# Patient Record
Sex: Female | Born: 1975 | Race: Asian | Hispanic: No | Smoking: Never smoker
Health system: Southern US, Community
[De-identification: ages and names within clinical notes are randomized; demographics above are authoritative.]

## PROBLEM LIST (undated history)

## (undated) DIAGNOSIS — K219 Gastro-esophageal reflux disease without esophagitis: Secondary | ICD-10-CM

---

## 2012-01-10 ENCOUNTER — Other Ambulatory Visit (HOSPITAL_COMMUNITY)
Admission: RE | Admit: 2012-01-10 | Discharge: 2012-01-10 | Disposition: A | Discharge status: Home / Self Care | Payer: BC Managed Care – PPO | Source: Ambulatory Visit | Attending: Family Medicine | Admitting: Family Medicine

## 2012-01-10 DIAGNOSIS — Z124 Encounter for screening for malignant neoplasm of cervix: Secondary | ICD-10-CM | POA: Insufficient documentation

## 2012-01-10 DIAGNOSIS — Z1151 Encounter for screening for human papillomavirus (HPV): Secondary | ICD-10-CM | POA: Insufficient documentation

## 2014-04-14 ENCOUNTER — Other Ambulatory Visit (HOSPITAL_COMMUNITY)
Admission: RE | Admit: 2014-04-14 | Discharge: 2014-04-14 | Disposition: A | Discharge status: Home / Self Care | Payer: BLUE CROSS/BLUE SHIELD | Source: Ambulatory Visit | Attending: Family Medicine | Admitting: Family Medicine

## 2014-04-14 ENCOUNTER — Other Ambulatory Visit: Payer: Self-pay | Admitting: Family Medicine

## 2014-04-14 DIAGNOSIS — Z124 Encounter for screening for malignant neoplasm of cervix: Secondary | ICD-10-CM | POA: Diagnosis not present

## 2014-04-15 LAB — CYTOLOGY - PAP

## 2015-07-05 DIAGNOSIS — Z3169 Encounter for other general counseling and advice on procreation: Secondary | ICD-10-CM | POA: Diagnosis not present

## 2015-07-05 DIAGNOSIS — Z130 Encounter for screening for diseases of the blood and blood-forming organs and certain disorders involving the immune mechanism: Secondary | ICD-10-CM | POA: Diagnosis not present

## 2015-08-27 DIAGNOSIS — Z3201 Encounter for pregnancy test, result positive: Secondary | ICD-10-CM | POA: Diagnosis not present

## 2015-09-23 DIAGNOSIS — O09511 Supervision of elderly primigravida, first trimester: Secondary | ICD-10-CM | POA: Diagnosis not present

## 2015-09-23 DIAGNOSIS — Z113 Encounter for screening for infections with a predominantly sexual mode of transmission: Secondary | ICD-10-CM | POA: Diagnosis not present

## 2015-09-23 DIAGNOSIS — Z3A10 10 weeks gestation of pregnancy: Secondary | ICD-10-CM | POA: Diagnosis not present

## 2015-09-23 LAB — OB RESULTS CONSOLE HIV ANTIBODY (ROUTINE TESTING): HIV: NONREACTIVE

## 2015-09-23 LAB — OB RESULTS CONSOLE RUBELLA ANTIBODY, IGM: Rubella: IMMUNE

## 2015-09-23 LAB — OB RESULTS CONSOLE HEPATITIS B SURFACE ANTIGEN: HEP B S AG: NEGATIVE

## 2015-09-23 LAB — OB RESULTS CONSOLE RPR: RPR: NONREACTIVE

## 2015-09-23 LAB — OB RESULTS CONSOLE GC/CHLAMYDIA
Chlamydia: NEGATIVE
Gonorrhea: NEGATIVE

## 2015-09-23 LAB — OB RESULTS CONSOLE ABO/RH: RH TYPE: POSITIVE

## 2015-09-23 LAB — OB RESULTS CONSOLE ANTIBODY SCREEN: Antibody Screen: NEGATIVE

## 2015-09-27 DIAGNOSIS — O09511 Supervision of elderly primigravida, first trimester: Secondary | ICD-10-CM | POA: Diagnosis not present

## 2015-09-27 DIAGNOSIS — Z3A10 10 weeks gestation of pregnancy: Secondary | ICD-10-CM | POA: Diagnosis not present

## 2015-11-05 DIAGNOSIS — O09512 Supervision of elderly primigravida, second trimester: Secondary | ICD-10-CM | POA: Diagnosis not present

## 2015-11-05 DIAGNOSIS — Z36 Encounter for antenatal screening of mother: Secondary | ICD-10-CM | POA: Diagnosis not present

## 2015-11-05 DIAGNOSIS — Z3A16 16 weeks gestation of pregnancy: Secondary | ICD-10-CM | POA: Diagnosis not present

## 2015-11-18 DIAGNOSIS — Z3A18 18 weeks gestation of pregnancy: Secondary | ICD-10-CM | POA: Diagnosis not present

## 2015-11-18 DIAGNOSIS — O09512 Supervision of elderly primigravida, second trimester: Secondary | ICD-10-CM | POA: Diagnosis not present

## 2016-01-31 DIAGNOSIS — Z23 Encounter for immunization: Secondary | ICD-10-CM | POA: Diagnosis not present

## 2016-01-31 DIAGNOSIS — Z3689 Encounter for other specified antenatal screening: Secondary | ICD-10-CM | POA: Diagnosis not present

## 2016-02-29 DIAGNOSIS — O09513 Supervision of elderly primigravida, third trimester: Secondary | ICD-10-CM | POA: Diagnosis not present

## 2016-02-29 DIAGNOSIS — Z3A32 32 weeks gestation of pregnancy: Secondary | ICD-10-CM | POA: Diagnosis not present

## 2016-03-17 DIAGNOSIS — Z3685 Encounter for antenatal screening for Streptococcus B: Secondary | ICD-10-CM | POA: Diagnosis not present

## 2016-03-17 LAB — OB RESULTS CONSOLE GBS: GBS: NEGATIVE

## 2016-04-07 DIAGNOSIS — Z3A38 38 weeks gestation of pregnancy: Secondary | ICD-10-CM | POA: Diagnosis not present

## 2016-04-07 DIAGNOSIS — O09513 Supervision of elderly primigravida, third trimester: Secondary | ICD-10-CM | POA: Diagnosis not present

## 2016-04-25 ENCOUNTER — Encounter (HOSPITAL_COMMUNITY): Payer: Self-pay | Admitting: *Deleted

## 2016-04-25 ENCOUNTER — Inpatient Hospital Stay (HOSPITAL_COMMUNITY): Payer: BLUE CROSS/BLUE SHIELD | Admitting: Anesthesiology

## 2016-04-25 ENCOUNTER — Inpatient Hospital Stay (HOSPITAL_COMMUNITY)
Admission: AD | Admit: 2016-04-25 | Discharge: 2016-04-29 | DRG: 765 | Disposition: A | Discharge status: Home / Self Care | Payer: BLUE CROSS/BLUE SHIELD | Source: Ambulatory Visit | Attending: Obstetrics & Gynecology | Admitting: Obstetrics & Gynecology

## 2016-04-25 DIAGNOSIS — D509 Iron deficiency anemia, unspecified: Secondary | ICD-10-CM | POA: Diagnosis not present

## 2016-04-25 DIAGNOSIS — Z9889 Other specified postprocedural states: Secondary | ICD-10-CM

## 2016-04-25 DIAGNOSIS — Z3A40 40 weeks gestation of pregnancy: Secondary | ICD-10-CM

## 2016-04-25 DIAGNOSIS — Z833 Family history of diabetes mellitus: Secondary | ICD-10-CM | POA: Diagnosis not present

## 2016-04-25 DIAGNOSIS — O43813 Placental infarction, third trimester: Secondary | ICD-10-CM | POA: Diagnosis not present

## 2016-04-25 DIAGNOSIS — O09513 Supervision of elderly primigravida, third trimester: Secondary | ICD-10-CM | POA: Diagnosis not present

## 2016-04-25 DIAGNOSIS — O480 Post-term pregnancy: Secondary | ICD-10-CM | POA: Diagnosis not present

## 2016-04-25 DIAGNOSIS — O9081 Anemia of the puerperium: Secondary | ICD-10-CM | POA: Diagnosis not present

## 2016-04-25 DIAGNOSIS — D62 Acute posthemorrhagic anemia: Secondary | ICD-10-CM | POA: Diagnosis not present

## 2016-04-25 DIAGNOSIS — Z23 Encounter for immunization: Secondary | ICD-10-CM | POA: Diagnosis not present

## 2016-04-25 DIAGNOSIS — O4103X0 Oligohydramnios, third trimester, not applicable or unspecified: Principal | ICD-10-CM | POA: Diagnosis present

## 2016-04-25 DIAGNOSIS — Z3A41 41 weeks gestation of pregnancy: Secondary | ICD-10-CM | POA: Diagnosis not present

## 2016-04-25 DIAGNOSIS — O99013 Anemia complicating pregnancy, third trimester: Secondary | ICD-10-CM | POA: Diagnosis not present

## 2016-04-25 HISTORY — DX: Gastro-esophageal reflux disease without esophagitis: K21.9

## 2016-04-25 LAB — TYPE AND SCREEN
ABO/RH(D): B POS
ANTIBODY SCREEN: NEGATIVE

## 2016-04-25 LAB — CBC
HEMATOCRIT: 40.2 % (ref 36.0–46.0)
HEMOGLOBIN: 13.9 g/dL (ref 12.0–15.0)
MCH: 31.7 pg (ref 26.0–34.0)
MCHC: 34.6 g/dL (ref 30.0–36.0)
MCV: 91.8 fL (ref 78.0–100.0)
Platelets: 217 10*3/uL (ref 150–400)
RBC: 4.38 MIL/uL (ref 3.87–5.11)
RDW: 12.3 % (ref 11.5–15.5)
WBC: 10.4 10*3/uL (ref 4.0–10.5)

## 2016-04-25 LAB — ABO/RH: ABO/RH(D): B POS

## 2016-04-25 MED ORDER — DIPHENHYDRAMINE HCL 50 MG/ML IJ SOLN: 12.5000 mg | INTRAMUSCULAR | Status: DC | PRN

## 2016-04-25 MED ORDER — FENTANYL 2.5 MCG/ML BUPIVACAINE 1/10 % EPIDURAL INFUSION (WH - ANES)
14.0000 mL/h | INTRAMUSCULAR | Status: DC | PRN
  Administered 2016-04-25: 11.5 mL/h via EPIDURAL
  Administered 2016-04-26: 14 mL/h via EPIDURAL
  Filled 2016-04-25 (×2): qty 100

## 2016-04-25 MED ORDER — OXYCODONE-ACETAMINOPHEN 5-325 MG PO TABS: 2.0000 | ORAL_TABLET | ORAL | Status: DC | PRN

## 2016-04-25 MED ORDER — EPHEDRINE 5 MG/ML INJ: 10.0000 mg | INTRAVENOUS | Status: DC | PRN

## 2016-04-25 MED ORDER — SOD CITRATE-CITRIC ACID 500-334 MG/5ML PO SOLN
30.0000 mL | ORAL | Status: DC | PRN
  Administered 2016-04-26: 30 mL via ORAL
  Filled 2016-04-25: qty 15

## 2016-04-25 MED ORDER — LIDOCAINE HCL (PF) 1 % IJ SOLN
INTRAMUSCULAR | Status: DC | PRN
  Administered 2016-04-25: 3 mL via EPIDURAL
  Administered 2016-04-25: 4 mL via EPIDURAL

## 2016-04-25 MED ORDER — LACTATED RINGERS IV SOLN
INTRAVENOUS | Status: DC
  Administered 2016-04-25 – 2016-04-26 (×3): via INTRAVENOUS

## 2016-04-25 MED ORDER — ACETAMINOPHEN 325 MG PO TABS: 650.0000 mg | ORAL_TABLET | ORAL | Status: DC | PRN

## 2016-04-25 MED ORDER — ONDANSETRON HCL 4 MG/2ML IJ SOLN: 4.0000 mg | Freq: Four times a day (QID) | INTRAMUSCULAR | Status: DC | PRN

## 2016-04-25 MED ORDER — OXYCODONE-ACETAMINOPHEN 5-325 MG PO TABS: 1.0000 | ORAL_TABLET | ORAL | Status: DC | PRN

## 2016-04-25 MED ORDER — LACTATED RINGERS IV SOLN
500.0000 mL | Freq: Once | INTRAVENOUS | Status: AC
  Administered 2016-04-26: 500 mL via INTRAVENOUS

## 2016-04-25 MED ORDER — LACTATED RINGERS IV SOLN
500.0000 mL | INTRAVENOUS | Status: DC | PRN
  Administered 2016-04-25: 1000 mL via INTRAVENOUS

## 2016-04-25 MED ORDER — LIDOCAINE HCL (PF) 1 % IJ SOLN
30.0000 mL | INTRAMUSCULAR | Status: DC | PRN
  Filled 2016-04-25: qty 30

## 2016-04-25 MED ORDER — OXYTOCIN 40 UNITS IN LACTATED RINGERS INFUSION - SIMPLE MED
2.5000 [IU]/h | INTRAVENOUS | Status: DC
  Filled 2016-04-25: qty 1000

## 2016-04-25 MED ORDER — PHENYLEPHRINE 40 MCG/ML (10ML) SYRINGE FOR IV PUSH (FOR BLOOD PRESSURE SUPPORT): 80.0000 ug | PREFILLED_SYRINGE | INTRAVENOUS | Status: DC | PRN

## 2016-04-25 MED ORDER — TERBUTALINE SULFATE 1 MG/ML IJ SOLN: 0.2500 mg | Freq: Once | INTRAMUSCULAR | Status: DC | PRN

## 2016-04-25 MED ORDER — FLEET ENEMA 7-19 GM/118ML RE ENEM: 1.0000 | ENEMA | RECTAL | Status: DC | PRN

## 2016-04-25 MED ORDER — PHENYLEPHRINE 40 MCG/ML (10ML) SYRINGE FOR IV PUSH (FOR BLOOD PRESSURE SUPPORT)
80.0000 ug | PREFILLED_SYRINGE | INTRAVENOUS | Status: DC | PRN
  Filled 2016-04-25: qty 10

## 2016-04-25 MED ORDER — OXYTOCIN BOLUS FROM INFUSION: 500.0000 mL | Freq: Once | INTRAVENOUS | Status: DC

## 2016-04-25 MED ORDER — OXYTOCIN 40 UNITS IN LACTATED RINGERS INFUSION - SIMPLE MED
1.0000 m[IU]/min | INTRAVENOUS | Status: DC
Start: 2016-04-25 — End: 2016-04-26
  Administered 2016-04-25: 2 m[IU]/min via INTRAVENOUS

## 2016-04-25 NOTE — Anesthesia Procedure Notes (Signed)
Epidural Patient location during procedure: OB Start time: 04/25/2016 6:46 PM  Staffing Anesthesiologist: Mal AmabileFOSTER, Brax Walen Performed: anesthesiologist   Preanesthetic Checklist Completed: patient identified, site marked, surgical consent, pre-op evaluation, timeout performed, IV checked, risks and benefits discussed and monitors and equipment checked  Epidural Patient position: sitting Prep: site prepped and draped and DuraPrep Patient monitoring: continuous pulse ox and blood pressure Approach: midline Location: L3-L4 Injection technique: LOR air  Needle:  Needle type: Tuohy  Needle gauge: 17 G Needle length: 9 cm and 9 Needle insertion depth: 4 cm Catheter type: closed end flexible Catheter size: 19 Gauge Catheter at skin depth: 9 cm Test dose: negative and Other  Assessment Events: blood not aspirated, injection not painful, no injection resistance, negative IV test and no paresthesia  Additional Notes Patient identified. Risks and benefits discussed including failed block, incomplete  Pain control, post dural puncture headache, nerve damage, paralysis, blood pressure Changes, nausea, vomiting, reactions to medications-both toxic and allergic and post Partum back pain. All questions were answered. Patient expressed understanding and wished to proceed. Sterile technique was used throughout procedure. Epidural site was Dressed with sterile barrier dressing. No paresthesias, signs of intravascular injection Or signs of intrathecal spread were encountered.  Patient was more comfortable after the epidural was dosed. Please see RN's note for documentation of vital signs and FHR which are stable.

## 2016-04-25 NOTE — Anesthesia Pain Management Evaluation Note (Signed)
  CRNA Pain Management Visit Note  Patient: Vanessa GreeningKaren Reifschneider, 41 y.o., female  "Hello I am a [facility] of the [facility]. We have an anesthesia team available at all times to provide care throughout the hospital, including epidural management and anesthesia for C-section. I don't know your plan for the delivery whether it a natural birth, water birth, IV sedation, nitrous supplementation, doula or epidural, but we want to meet your pain goals."   1.Was your pain managed to your expectations on prior hospitalizations?   No prior hospitalizations  2.What is your expectation for pain management during this hospitalization?     Epidural  3.How can we help you reach that goal? epidural  Record the patient's initial score and the patient's pain goal.   Pain: 4  Pain Goal: 6 [facility] wants you to be able to say your pain was always managed very well.  Tylea Hise 04/25/2016

## 2016-04-25 NOTE — MAU Note (Signed)
Sent from office, 3cm dilated, contracting.  AFI 3, BPP6/8. Pt is for direct admit

## 2016-04-25 NOTE — H&P (Signed)
Vanessa GreeningKaren [name] is a 41 y.o. female G1P0 7150w6d presenting for Induction of Labor d/t OligoHydramnios post term.  HPP/HPI:  Normal pregnancy up to now.  [facility] revealed Oligo with AFI at 3+ (1%), EFW 8 Lbs.  BPP 6/8.  Favorable cervix.  Good FMs.  No AF leak.  No vaginal bleeding.  Irregular UCs.  No PIH Sx.  OB History    Gravida Para Term Preterm AB Living   1             SAB TAB Ectopic Multiple Live Births                 Past Medical History:  Diagnosis Date  . GERD (gastroesophageal reflux disease)    Past Surgical History:  Procedure Laterality Date  . NO PAST SURGERIES     Family History: family history includes Cancer in her maternal aunt; Diabetes in her father; Kidney disease in her mother. Social History:  reports that she has never smoked. She has never used smokeless tobacco. She reports that she does not drink alcohol or use drugs.  Allergies  Allergen Reactions  . Fluconazole Nausea And Vomiting and Other (See Comments)    thrush      Blood pressure 119/73, pulse 78, temperature 98.2 F (36.8 C), temperature source Oral, resp. rate 16, height 5\' 1"  (1.549 m), weight 141 lb 8 oz (64.2 kg).   Exam Physical Exam   VE in office 3/100/Vtx/-1 intact  FHR 140's with good variability, accelerations present, no deceleration. Irregular UCs q about 6 min, mild.  HPP:  Patient Active Problem List   Diagnosis Date Noted  . Normal labor and delivery 04/25/2016    Prenatal labs: ABO, Rh: --/--/B POS (02/06 1358) Antibody: PENDING (02/06 1358) Rubella: Immune RPR: Nonreactive (07/06 0000)  HBsAg: Negative (07/06 0000)  HIV: Non-reactive (07/06 0000)  Genetic testing: Panorama low risk female, AFP1 neg. US anato: wnl 1 hr GTT: wnl GBS: Negative (12/29 0000)   Assessment/Plan: G1 40 6/7 wks with OligoHydramnios.  FHR Cat 1.  Induction with Pitocin.  Expectant management towards probable vaginal delivery.  Epidural PRN.  Marie-Lyne  Trace Wirick 04/25/2016, 3:16 PM

## 2016-04-25 NOTE — Anesthesia Preprocedure Evaluation (Signed)
Anesthesia Evaluation  Patient identified by MRN, date of birth, ID band Patient awake    Reviewed: Allergy & Precautions, H&P , Patient's Chart, lab work & pertinent test results  Airway Mallampati: I  TM Distance: >3 FB Neck ROM: full    Dental no notable dental hx.    Pulmonary neg pulmonary ROS,    Pulmonary exam normal        Cardiovascular negative cardio ROS Normal cardiovascular exam     Neuro/Psych negative neurological ROS  negative psych ROS   GI/Hepatic Neg liver ROS,   Endo/Other  negative endocrine ROS  Renal/GU negative Renal ROS     Musculoskeletal negative musculoskeletal ROS (+)   Abdominal Normal abdominal exam  (+)   Peds  Hematology negative hematology ROS (+)   Anesthesia Other Findings   Reproductive/Obstetrics (+) Pregnancy                             Anesthesia Physical Anesthesia Plan  ASA: II  Anesthesia Plan: Epidural   Post-op Pain Management:    Induction:   Airway Management Planned:   Additional Equipment:   Intra-op Plan:   Post-operative Plan:   Informed Consent: I have reviewed the patients History and Physical, chart, labs and discussed the procedure including the risks, benefits and alternatives for the proposed anesthesia with the patient or authorized representative who has indicated his/her understanding and acceptance.     Plan Discussed with:   Anesthesia Plan Comments:         Anesthesia Quick Evaluation

## 2016-04-26 ENCOUNTER — Encounter (HOSPITAL_COMMUNITY): Payer: Self-pay | Admitting: Obstetrics and Gynecology

## 2016-04-26 ENCOUNTER — Encounter (HOSPITAL_COMMUNITY)
Admission: AD | Disposition: A | Discharge status: Home / Self Care | Payer: Self-pay | Source: Ambulatory Visit | Attending: Obstetrics & Gynecology

## 2016-04-26 DIAGNOSIS — Z9889 Other specified postprocedural states: Secondary | ICD-10-CM

## 2016-04-26 LAB — RPR: RPR Ser Ql: NONREACTIVE

## 2016-04-26 SURGERY — Surgical Case
Anesthesia: Epidural

## 2016-04-26 MED ORDER — NALBUPHINE HCL 10 MG/ML IJ SOLN: 5.0000 mg | Freq: Once | INTRAMUSCULAR | Status: DC | PRN

## 2016-04-26 MED ORDER — DIPHENHYDRAMINE HCL 50 MG/ML IJ SOLN
12.5000 mg | INTRAMUSCULAR | Status: DC | PRN
Start: 2016-04-26 — End: 2016-04-28

## 2016-04-26 MED ORDER — CEFAZOLIN SODIUM-DEXTROSE 2-3 GM-% IV SOLR
INTRAVENOUS | Status: DC | PRN
  Administered 2016-04-26: 2 g via INTRAVENOUS

## 2016-04-26 MED ORDER — SIMETHICONE 80 MG PO CHEW
80.0000 mg | CHEWABLE_TABLET | Freq: Three times a day (TID) | ORAL | Status: DC
  Administered 2016-04-26 – 2016-04-29 (×7): 80 mg via ORAL
  Filled 2016-04-26 (×6): qty 1

## 2016-04-26 MED ORDER — IBUPROFEN 600 MG PO TABS: 600.0000 mg | ORAL_TABLET | Freq: Four times a day (QID) | ORAL | Status: DC | PRN

## 2016-04-26 MED ORDER — NALBUPHINE HCL 10 MG/ML IJ SOLN: 5.0000 mg | INTRAMUSCULAR | Status: DC | PRN

## 2016-04-26 MED ORDER — FENTANYL CITRATE (PF) 100 MCG/2ML IJ SOLN: 25.0000 ug | INTRAMUSCULAR | Status: DC | PRN

## 2016-04-26 MED ORDER — MORPHINE SULFATE (PF) 0.5 MG/ML IJ SOLN
INTRAMUSCULAR | Status: DC | PRN
Start: 2016-04-26 — End: 2016-04-26
  Administered 2016-04-26: 3 mg via EPIDURAL

## 2016-04-26 MED ORDER — ONDANSETRON HCL 4 MG/2ML IJ SOLN
INTRAMUSCULAR | Status: DC | PRN
  Administered 2016-04-26: 4 mg via INTRAVENOUS

## 2016-04-26 MED ORDER — MEPERIDINE HCL 25 MG/ML IJ SOLN: 6.2500 mg | INTRAMUSCULAR | Status: DC | PRN

## 2016-04-26 MED ORDER — PRENATAL MULTIVITAMIN CH
1.0000 | ORAL_TABLET | Freq: Every day | ORAL | Status: DC
  Administered 2016-04-27 – 2016-04-28 (×2): 1 via ORAL
  Filled 2016-04-26 (×2): qty 1

## 2016-04-26 MED ORDER — KETOROLAC TROMETHAMINE 30 MG/ML IJ SOLN: 30.0000 mg | Freq: Once | INTRAMUSCULAR | Status: DC

## 2016-04-26 MED ORDER — COCONUT OIL OIL
1.0000 "application " | TOPICAL_OIL | Status: DC | PRN
  Administered 2016-04-28: 1 via TOPICAL
  Filled 2016-04-26: qty 120

## 2016-04-26 MED ORDER — SIMETHICONE 80 MG PO CHEW: 80.0000 mg | CHEWABLE_TABLET | ORAL | Status: DC | PRN

## 2016-04-26 MED ORDER — BUPIVACAINE HCL (PF) 0.25 % IJ SOLN
INTRAMUSCULAR | Status: AC
  Filled 2016-04-26: qty 10

## 2016-04-26 MED ORDER — SODIUM BICARBONATE 8.4 % IV SOLN
INTRAVENOUS | Status: AC
  Filled 2016-04-26: qty 50

## 2016-04-26 MED ORDER — OXYCODONE HCL 5 MG PO TABS: 10.0000 mg | ORAL_TABLET | ORAL | Status: DC | PRN

## 2016-04-26 MED ORDER — OXYCODONE HCL 5 MG PO TABS: 5.0000 mg | ORAL_TABLET | ORAL | Status: DC | PRN

## 2016-04-26 MED ORDER — DIBUCAINE 1 % RE OINT: 1.0000 "application " | TOPICAL_OINTMENT | RECTAL | Status: DC | PRN

## 2016-04-26 MED ORDER — OXYTOCIN 10 UNIT/ML IJ SOLN
INTRAMUSCULAR | Status: AC
  Filled 2016-04-26: qty 4

## 2016-04-26 MED ORDER — IBUPROFEN 600 MG PO TABS
600.0000 mg | ORAL_TABLET | Freq: Four times a day (QID) | ORAL | Status: DC
  Administered 2016-04-26 – 2016-04-29 (×12): 600 mg via ORAL
  Filled 2016-04-26 (×12): qty 1

## 2016-04-26 MED ORDER — SCOPOLAMINE 1 MG/3DAYS TD PT72: 1.0000 | MEDICATED_PATCH | Freq: Once | TRANSDERMAL | Status: DC

## 2016-04-26 MED ORDER — KETOROLAC TROMETHAMINE 30 MG/ML IJ SOLN
30.0000 mg | Freq: Four times a day (QID) | INTRAMUSCULAR | Status: AC | PRN
  Administered 2016-04-26: 30 mg via INTRAMUSCULAR

## 2016-04-26 MED ORDER — ACETAMINOPHEN 325 MG PO TABS
650.0000 mg | ORAL_TABLET | ORAL | Status: DC | PRN
  Administered 2016-04-28: 650 mg via ORAL
  Filled 2016-04-26: qty 2

## 2016-04-26 MED ORDER — MORPHINE SULFATE (PF) 0.5 MG/ML IJ SOLN
INTRAMUSCULAR | Status: AC
  Filled 2016-04-26: qty 10

## 2016-04-26 MED ORDER — BUPIVACAINE HCL 0.25 % IJ SOLN
INTRAMUSCULAR | Status: DC | PRN
  Administered 2016-04-26: 10 mL

## 2016-04-26 MED ORDER — ONDANSETRON HCL 4 MG/2ML IJ SOLN: 4.0000 mg | Freq: Three times a day (TID) | INTRAMUSCULAR | Status: DC | PRN

## 2016-04-26 MED ORDER — NALOXONE HCL 2 MG/2ML IJ SOSY
1.0000 ug/kg/h | PREFILLED_SYRINGE | INTRAVENOUS | Status: DC | PRN
  Filled 2016-04-26: qty 2

## 2016-04-26 MED ORDER — MENTHOL 3 MG MT LOZG: 1.0000 | LOZENGE | OROMUCOSAL | Status: DC | PRN

## 2016-04-26 MED ORDER — SODIUM CHLORIDE 0.9% FLUSH: 3.0000 mL | INTRAVENOUS | Status: DC | PRN

## 2016-04-26 MED ORDER — LACTATED RINGERS IV SOLN
INTRAVENOUS | Status: DC | PRN
  Administered 2016-04-26 (×4): via INTRAVENOUS

## 2016-04-26 MED ORDER — ONDANSETRON HCL 4 MG/2ML IJ SOLN
INTRAMUSCULAR | Status: AC
  Filled 2016-04-26: qty 2

## 2016-04-26 MED ORDER — ACETAMINOPHEN 500 MG PO TABS
1000.0000 mg | ORAL_TABLET | Freq: Four times a day (QID) | ORAL | Status: AC
  Administered 2016-04-27 (×2): 1000 mg via ORAL
  Filled 2016-04-26 (×2): qty 2

## 2016-04-26 MED ORDER — LACTATED RINGERS IV SOLN
INTRAVENOUS | Status: DC
  Administered 2016-04-26 (×2): via INTRAVENOUS

## 2016-04-26 MED ORDER — SENNOSIDES-DOCUSATE SODIUM 8.6-50 MG PO TABS
2.0000 | ORAL_TABLET | ORAL | Status: DC
  Administered 2016-04-27 – 2016-04-28 (×3): 2 via ORAL
  Filled 2016-04-26 (×3): qty 2

## 2016-04-26 MED ORDER — DIPHENHYDRAMINE HCL 25 MG PO CAPS
25.0000 mg | ORAL_CAPSULE | ORAL | Status: DC | PRN
Start: 2016-04-26 — End: 2016-04-28
  Filled 2016-04-26: qty 1

## 2016-04-26 MED ORDER — OXYTOCIN 10 UNIT/ML IJ SOLN
INTRAMUSCULAR | Status: DC | PRN
  Administered 2016-04-26: 40 [IU] via INTRAVENOUS

## 2016-04-26 MED ORDER — KETOROLAC TROMETHAMINE 30 MG/ML IJ SOLN: 30.0000 mg | Freq: Four times a day (QID) | INTRAMUSCULAR | Status: AC | PRN

## 2016-04-26 MED ORDER — LIDOCAINE-EPINEPHRINE (PF) 2 %-1:200000 IJ SOLN
INTRAMUSCULAR | Status: DC | PRN
  Administered 2016-04-26: 10 mL via EPIDURAL

## 2016-04-26 MED ORDER — KETOROLAC TROMETHAMINE 30 MG/ML IJ SOLN
INTRAMUSCULAR | Status: AC
  Filled 2016-04-26: qty 1

## 2016-04-26 MED ORDER — FERROUS SULFATE 325 (65 FE) MG PO TABS
325.0000 mg | ORAL_TABLET | Freq: Two times a day (BID) | ORAL | Status: DC
  Administered 2016-04-26 – 2016-04-29 (×6): 325 mg via ORAL
  Filled 2016-04-26 (×6): qty 1

## 2016-04-26 MED ORDER — DIPHENHYDRAMINE HCL 25 MG PO CAPS: 25.0000 mg | ORAL_CAPSULE | Freq: Four times a day (QID) | ORAL | Status: DC | PRN

## 2016-04-26 MED ORDER — WITCH HAZEL-GLYCERIN EX PADS: 1.0000 "application " | MEDICATED_PAD | CUTANEOUS | Status: DC | PRN

## 2016-04-26 MED ORDER — PHENYLEPHRINE 40 MCG/ML (10ML) SYRINGE FOR IV PUSH (FOR BLOOD PRESSURE SUPPORT)
PREFILLED_SYRINGE | INTRAVENOUS | Status: AC
  Filled 2016-04-26: qty 10

## 2016-04-26 MED ORDER — LACTATED RINGERS IV SOLN
INTRAVENOUS | Status: DC | PRN
  Administered 2016-04-26: 09:00:00 via INTRAVENOUS

## 2016-04-26 MED ORDER — SIMETHICONE 80 MG PO CHEW
80.0000 mg | CHEWABLE_TABLET | ORAL | Status: DC
  Administered 2016-04-27 – 2016-04-28 (×3): 80 mg via ORAL
  Filled 2016-04-26 (×3): qty 1

## 2016-04-26 MED ORDER — NALOXONE HCL 0.4 MG/ML IJ SOLN: 0.4000 mg | INTRAMUSCULAR | Status: DC | PRN

## 2016-04-26 MED ORDER — ZOLPIDEM TARTRATE 5 MG PO TABS: 5.0000 mg | ORAL_TABLET | Freq: Every evening | ORAL | Status: DC | PRN

## 2016-04-26 MED ORDER — PROMETHAZINE HCL 25 MG/ML IJ SOLN: 6.2500 mg | INTRAMUSCULAR | Status: DC | PRN

## 2016-04-26 MED ORDER — LIDOCAINE-EPINEPHRINE (PF) 2 %-1:200000 IJ SOLN
INTRAMUSCULAR | Status: AC
  Filled 2016-04-26: qty 20

## 2016-04-26 MED ORDER — TETANUS-DIPHTH-ACELL PERTUSSIS 5-2.5-18.5 LF-MCG/0.5 IM SUSP
0.5000 mL | Freq: Once | INTRAMUSCULAR | Status: DC
Start: 2016-04-27 — End: 2016-04-28

## 2016-04-26 MED ORDER — MAGNESIUM HYDROXIDE 400 MG/5ML PO SUSP: 30.0000 mL | ORAL | Status: DC | PRN

## 2016-04-26 MED ORDER — OXYTOCIN 40 UNITS IN LACTATED RINGERS INFUSION - SIMPLE MED: 2.5000 [IU]/h | INTRAVENOUS | Status: AC

## 2016-04-26 MED ORDER — PHENYLEPHRINE HCL 10 MG/ML IJ SOLN
INTRAMUSCULAR | Status: DC | PRN
  Administered 2016-04-26: 40 ug via INTRAVENOUS

## 2016-04-26 SURGICAL SUPPLY — 37 items
CHLORAPREP W/TINT 26ML (MISCELLANEOUS) ×2 IMPLANT
CLAMP CORD UMBIL (MISCELLANEOUS) IMPLANT
CLOTH BEACON ORANGE TIMEOUT ST (SAFETY) ×2 IMPLANT
CONTAINER PREFILL 10% NBF 15ML (MISCELLANEOUS) IMPLANT
DERMABOND ADVANCED (GAUZE/BANDAGES/DRESSINGS)
DERMABOND ADVANCED .7 DNX12 (GAUZE/BANDAGES/DRESSINGS) IMPLANT
ELECT REM PT RETURN 9FT ADLT (ELECTROSURGICAL) ×2
ELECTRODE REM PT RTRN 9FT ADLT (ELECTROSURGICAL) ×1 IMPLANT
EXTRACTOR VACUUM M CUP 4 TUBE (SUCTIONS) IMPLANT
GLOVE BIO SURGEON STRL SZ 6.5 (GLOVE) ×2 IMPLANT
GLOVE BIOGEL PI IND STRL 7.0 (GLOVE) ×2 IMPLANT
GLOVE BIOGEL PI INDICATOR 7.0 (GLOVE) ×2
GOWN STRL REUS W/TWL LRG LVL3 (GOWN DISPOSABLE) ×4 IMPLANT
KIT ABG SYR 3ML LUER SLIP (SYRINGE) IMPLANT
NEEDLE HYPO 22GX1.5 SAFETY (NEEDLE) ×2 IMPLANT
NEEDLE HYPO 25X5/8 SAFETYGLIDE (NEEDLE) IMPLANT
PACK C SECTION WH (CUSTOM PROCEDURE TRAY) ×2 IMPLANT
PAD OB MATERNITY 4.3X12.25 (PERSONAL CARE ITEMS) ×2 IMPLANT
RTRCTR C-SECT PINK 25CM LRG (MISCELLANEOUS) ×2 IMPLANT
SUT MNCRL AB 3-0 PS2 27 (SUTURE) IMPLANT
SUT MON AB 4-0 PS1 27 (SUTURE) IMPLANT
SUT PLAIN 0 NONE (SUTURE) IMPLANT
SUT PLAIN 2 0 (SUTURE) ×1
SUT PLAIN ABS 2-0 CT1 27XMFL (SUTURE) ×1 IMPLANT
SUT VIC AB 0 CT1 27 (SUTURE) ×2
SUT VIC AB 0 CT1 27XBRD ANBCTR (SUTURE) ×2 IMPLANT
SUT VIC AB 0 CTX 36 (SUTURE) ×2
SUT VIC AB 0 CTX36XBRD ANBCTRL (SUTURE) ×2 IMPLANT
SUT VIC AB 2-0 CT1 27 (SUTURE) ×1
SUT VIC AB 2-0 CT1 TAPERPNT 27 (SUTURE) ×1 IMPLANT
SUT VIC AB 3-0 SH 27 (SUTURE)
SUT VIC AB 3-0 SH 27X BRD (SUTURE) IMPLANT
SUT VIC AB 4-0 PS2 27 (SUTURE) IMPLANT
SUT VICRYL 4-0 PS2 18IN ABS (SUTURE) ×2 IMPLANT
SYR CONTROL 10ML LL (SYRINGE) ×2 IMPLANT
TOWEL OR 17X24 6PK STRL BLUE (TOWEL DISPOSABLE) ×2 IMPLANT
TRAY FOLEY CATH SILVER 14FR (SET/KITS/TRAYS/PACK) ×2 IMPLANT

## 2016-04-26 NOTE — Transfer of Care (Signed)
Immediate Anesthesia Transfer of Care Note  Patient: Vanessa GreeningKaren [name]  Procedure(s) Performed: Procedure(s): CESAREAN SECTION (N/A)  Patient Location: PACU  Anesthesia Type:Epidural  Level of Consciousness: awake, alert  and oriented  Airway & Oxygen Therapy: Patient Spontanous Breathing  Post-op Assessment: Report given to RN and Post -op Vital signs reviewed and stable  Post vital signs: Reviewed and stable  Last Vitals:  Vitals:   04/26/16 0601 04/26/16 0631  BP: (!) 113/54 (!) 110/51  Pulse: 76 75  Resp: 16 16  Temp:      Last Pain:  Vitals:   04/26/16 0631  TempSrc:   PainSc: 0-No pain         Complications: No apparent anesthesia complications

## 2016-04-26 NOTE — Op Note (Signed)
Preoperative diagnosis: Intrauterine pregnancy at 41 weeks and 0 day                                            Oligohydramnios                                            Arrest of progression in active stage of labor                                            Non-reassuring Fetal Heart Rate monitoring with Repetitive deep Variable decelerations  Post operative diagnosis: Same  Anesthesia: Epidural  Anesthesiologist: Dr. Leilani AbleFranklin Hatchett  Procedure: Primary Urgent low transverse cesarean section  Surgeon: Dr. Genia DelMarie-Lyne Aiden Helzer  Assistant: Denton Meekolita Dawson   Estimated blood loss: 900 cc  Procedure:  After being informed of the planned procedure and possible complications including bleeding, infection, injury to other organs, informed consent is obtained. The patient is taken to OR #1 and the epidural anesthesia level was raised without complication. She is placed in the dorsal decubitus position with the pelvis tilted to the left. She is then prepped and draped in a sterile fashion. A Foley catheter is already inserted in her bladder.  After assessing adequate level of anesthesia, we infiltrate the suprapubic area with 10 cc of Marcaine 0.25 and perform a Pfannenstiel incision which is brought down sharply to the fascia. The fascia is entered in a low transverse fashion. Linea alba is dissected. Peritoneum is entered in a midline fashion. An Alexis retractor is easily positioned. Visceral peritoneum is entered in a low transverse fashion allowing us to safely retract bladder by developing a bladder flap.  The myometrium is then entered in a low transverse fashion; first with knife and then extended bluntly. Amniotic fluid is meconium. We assist the birth of a female  infant in cephalic presentation. Mouth and nose are suctioned. The baby is delivered. The cord is clamped and sectioned. The baby is given to the neonatologist present in the room.  10 cc of blood is drawn from the umbilical vein.   The PH is done and comes back at 7.24.The placenta is allowed to deliver spontaneously. It is complete and the cord has 3 vessels. Uterine revision is negative.  We proceed with closure of the myometrium in 2 layers: First with a running locked suture of 0 Vicryl, then with a Lembert suture of 0 Vicryl imbricating the first one. Hemostasis is completed with 2 figures of eight on the right aspect of the hysterotomy and cauterization on peritoneal edges.  Both paracolic gutters are cleaned. Both tubes and ovaries are assessed and normal. We confirm a satisfactory hemostasis.  Retractors and sponges are removed. Under fascia hemostasis is completed with cauterization.  The parietal peritoneum is closed with a running suture of Vicryl 2-0. The fascia is then closed with 2 running sutures of 0 Vicryl meeting midline.  Hemostasis is completed with cauterization. The adipose tissue is approximated with a running suture of Plain 2-0. The skin is closed with a subcuticular suture of 4-0 Vicryl and Dermabond.  Instrument and sponge count is complete x2. Estimated blood loss  is 900 cc.  The procedure is well tolerated by the patient who is taken to recovery room in a well and stable condition.  female baby named Shari Prows was born at 8:34 am and received an Apgar of 9  at 1 minute and 9 at 5 minutes. Weight was pending.    Specimen: Placenta sent to Patho.   Genia Del MD 2/7/20189:20 AM

## 2016-04-26 NOTE — Progress Notes (Signed)
At 2130 ambulation to bathroom RN noted epidural catheter left in by PACU. CRNA Jana caled to report. She is coming to bedside to remove. Platlets are normal and no BTL is planned this in mom's first child.

## 2016-04-26 NOTE — Lactation Note (Signed)
This note was copied from a baby's chart. Lactation Consultation Note  Assisted mother to latch baby to the left breast. Suckling appeared to be deep and swallows were noted but baby abraded the tip of the nipple. Baby did not attach to the right breast related to raised area on areola and positioning. Hand expression taught with colostrum easily visible.  Baby [facility]-skin [facility] and mom was ready for transport. Follow-up tomorrow or sooner if needed.  Patient Name: Boy Annell GreeningKaren Buskey ZOXWR'UToday's Date: 04/26/2016 Reason for consult: Initial assessment   Maternal Data Has patient been taught Hand Expression?: No (taught FOB as mother was too tired.) Does the patient have breastfeeding experience prior to this delivery?: No  Feeding Feeding Type: Breast Fed Length of feed: 30 min  LATCH Score/Interventions Latch: Repeated attempts needed to sustain latch, nipple held in mouth throughout feeding, stimulation needed to elicit sucking reflex.  Audible Swallowing: A few with stimulation  Type of Nipple: Everted at rest and after stimulation (pigmented raised area near nipple on rt areola)  Comfort (Breast/Nipple): Filling, red/small blisters or bruises, mild/mod discomfort (noted after detachment)     Hold (Positioning): Full assist, staff holds infant at breast  LATCH Score: 5  Lactation Tools Discussed/Used     Consult Status Consult Status: Follow-up Date: 04/27/16 Follow-up type: In-patient    Soyla DryerJoseph, Hannahmarie Asberry 04/26/2016, 10:44 AM

## 2016-04-26 NOTE — Anesthesia Postprocedure Evaluation (Signed)
Anesthesia Post Note  Patient: Annell GreeningKaren Alan  Procedure(s) Performed: Procedure(s) (LRB): CESAREAN SECTION (N/A)  Patient location during evaluation: PACU Anesthesia Type: Epidural Level of consciousness: awake Pain management: pain level controlled Vital Signs Assessment: post-procedure vital signs reviewed and stable Respiratory status: spontaneous breathing Cardiovascular status: stable Postop Assessment: no headache, no backache, patient able to bend at knees and no signs of nausea or vomiting Anesthetic complications: no        Last Vitals:  Vitals:   04/26/16 1005 04/26/16 1006  BP:    Pulse: 93 82  Resp: 19 17  Temp:      Last Pain:  Vitals:   04/26/16 0631  TempSrc:   PainSc: 0-No pain   Pain Goal:                 Harriette Tovey JR,JOHN Tonae Livolsi

## 2016-04-26 NOTE — Progress Notes (Signed)
Subjective: Doing well, pain controled, UCs q3 min  Anesthesia epidural   Objective: BP (!) 110/51   Pulse 75   Temp 98.7 F (37.1 C) (Oral)   Resp 16   Ht 5\' 1"  (1.549 m)   Wt 141 lb 8 oz (64.2 kg)   SpO2 99%   BMI 26.74 kg/m    FHT:  FHR: 145 bpm, variability: moderate,  accelerations:  Present,  decelerations:  Present Frequent variable decelerations mild to moderate with UCs UC:   regular, every 3 minutes VE:   Dilation: 9 Effacement (%): 100 Station: 0 Exam by:: Vanessa SchneidersMarie Mariyah Upshaw, MD   Occiput Rt ant.   Assessment / Plan: Induction for Oligo at 41 wks.  Stopped the Pitocin because of Repetitive Variable Decelerations.  FHR now improved but slow progression to complete dilation, so Pitocin restarted.  Fetal Wellbeing:  Category I Pain Control:  Epidural  Anticipated MOD:  Garded, possible Urgent C/S discussed  Vanessa [name] 04/26/2016, 7:08 AM

## 2016-04-26 NOTE — Progress Notes (Signed)
IV checked at 1240, not 1225

## 2016-04-26 NOTE — Anesthesia Postprocedure Evaluation (Addendum)
Anesthesia Post Note  Patient: Vanessa GreeningKaren [name]  Procedure(s) Performed: Procedure(s) (LRB): CESAREAN SECTION (N/A)  Patient location during evaluation: Mother Baby Anesthesia Type: Epidural Level of consciousness: awake, awake and alert and oriented Pain management: pain level controlled Vital Signs Assessment: post-procedure vital signs reviewed and stable Respiratory status: spontaneous breathing and nonlabored ventilation Cardiovascular status: stable Postop Assessment: no headache, no backache, epidural receding, patient able to bend at knees, no signs of nausea or vomiting and adequate PO intake Anesthetic complications: no        Last Vitals:  Vitals:   04/26/16 1005 04/26/16 1006  BP:    Pulse: 93 82  Resp: 19 17  Temp:      Last Pain:  Vitals:   04/26/16 0631  TempSrc:   PainSc: 0-No pain   Pain Goal:                 Land O'LakesMalinova,Nataliya Hristova

## 2016-04-26 NOTE — Addendum Note (Signed)
Addendum  created 04/26/16 2214 by Renford DillsJanet L Rahaf Carbonell, CRNA   Anesthesia Intra LDAs edited, LDA properties accepted

## 2016-04-26 NOTE — Addendum Note (Signed)
Addendum  created 04/26/16 1320 by Elgie CongoNataliya H Merrik Puebla, CRNA   Sign clinical note

## 2016-04-27 DIAGNOSIS — D62 Acute posthemorrhagic anemia: Secondary | ICD-10-CM | POA: Diagnosis not present

## 2016-04-27 LAB — CBC
HCT: 22.6 % — ABNORMAL LOW (ref 36.0–46.0)
Hemoglobin: 7.9 g/dL — ABNORMAL LOW (ref 12.0–15.0)
MCH: 31.7 pg (ref 26.0–34.0)
MCHC: 35 g/dL (ref 30.0–36.0)
MCV: 90.8 fL (ref 78.0–100.0)
PLATELETS: 137 10*3/uL — AB (ref 150–400)
RBC: 2.49 MIL/uL — AB (ref 3.87–5.11)
RDW: 12.4 % (ref 11.5–15.5)
WBC: 10.9 10*3/uL — AB (ref 4.0–10.5)

## 2016-04-27 MED ORDER — POLYSACCHARIDE IRON COMPLEX 150 MG PO CAPS
150.0000 mg | ORAL_CAPSULE | Freq: Two times a day (BID) | ORAL | Status: DC
  Administered 2016-04-27 – 2016-04-29 (×4): 150 mg via ORAL
  Filled 2016-04-27 (×4): qty 1

## 2016-04-27 MED ORDER — MAGNESIUM OXIDE 400 (241.3 MG) MG PO TABS
400.0000 mg | ORAL_TABLET | Freq: Every day | ORAL | Status: DC
  Administered 2016-04-27 – 2016-04-29 (×3): 400 mg via ORAL
  Filled 2016-04-27 (×5): qty 1

## 2016-04-27 NOTE — Lactation Note (Signed)
This note was copied from a baby's chart. Lactation Consultation Note  Patient Name: Vanessa Annell GreeningKaren Engelhard JXBJY'NToday's Date: 04/27/2016 Reason for consult: Follow-up assessment Baby at 32 hr of life. Mom has a vertical dark brown healing compression stripe across the L nipple and horizontal dark brown healing compression stripe across the R nipple. She denies breast or nipple pain. She stated that "sometimes when he latches it hurts so I take him off and then put him on again and its fine". She has what she calls a skin tag on the areola of the R breast near the nipple at 3 o'clock. She holds the skin tag back when she latches baby because she does not want to get it in his mouth. Discussed baby behavior, feeding frequency, baby belly size, voids, wt loss, breast changes, and nipple care. Demonstrated manual expression, colostrum noted bilaterally, spoon in room. Given lactation handouts. Aware of OP services and support group.       Maternal Data    Feeding Feeding Type: Breast Fed  LATCH Score/Interventions Latch: Repeated attempts needed to sustain latch, nipple held in mouth throughout feeding, stimulation needed to elicit sucking reflex. Intervention(s): Adjust position;Breast compression  Audible Swallowing: A few with stimulation Intervention(s): Skin to skin;Hand expression Intervention(s): Alternate breast massage  Type of Nipple: Everted at rest and after stimulation  Comfort (Breast/Nipple): Filling, red/small blisters or bruises, mild/mod discomfort  Problem noted: Mild/Moderate discomfort;Cracked, bleeding, blisters, bruises Interventions  (Cracked/bleeding/bruising/blister): Expressed breast milk to nipple Interventions (Mild/moderate discomfort):  (coconut oil)  Hold (Positioning): Assistance needed to correctly position infant at breast and maintain latch. Intervention(s): Position options;Support Pillows  LATCH Score: 6  Lactation Tools Discussed/Used WIC Program:  No   Consult Status Consult Status: Follow-up Date: 04/28/16 Follow-up type: In-patient    Rulon Eisenmengerlizabeth E Ayaz Sondgeroth 04/27/2016, 4:51 PM

## 2016-04-27 NOTE — Progress Notes (Signed)
Subjective: POD# 1 Information for the patient's newborn:  [name], Boy Majorie [[id number]][[id number]]  female  Circ planned Reports feeling well.  Dizziness with standing Feeding: breast Patient reports tolerating PO.  Breast symptoms:none Pain controlled withibuprofen and Percocet Denies HA/SOB/N/V. Flatus +. She reports vaginal bleeding as normal, without clots.  She is ambulating, urinating without difficulty since foley removal.    Objective:   VS:    Vitals:   04/26/16 2126 04/27/16 0130 04/27/16 0524 04/27/16 0850  BP: (!) 104/51 (!) 106/44 98/60 (!) 101/51  Pulse:  88 79 81  Resp: 16 18 18 18   Temp: 98.6 F (37 C) 98.6 F (37 C) 98.3 F (36.8 C) 98.2 F (36.8 C)  TempSrc: Oral Oral Oral Oral  SpO2: 97%  98% 98%  Weight:      Height:         Intake/Output Summary (Last 24 hours) at 04/27/16 1022 Last data filed at 04/27/16 0850  Gross per 24 hour  Intake             1200 ml  Output             2800 ml  Net            -1600 ml        Recent Labs  04/25/16 1358 04/27/16 0527  WBC 10.4 10.9*  HGB 13.9 7.9*  HCT 40.2 22.6*  PLT 217 137*     Blood type: --/--/B POS (02/06 1400)  Rubella: Immune (07/06 0000)     Physical Exam:  General: alert, cooperative and no distress CV: Regular rate and rhythm, S1S2 present or without murmur or extra heart sounds Resp: clear bilaterally Abdomen: soft, nontender, normal bowel sounds Incision: clean, dry and intact Uterine Fundus: firm, below umbilicus, nontender Lochia: minimal Ext: extremities normal, atraumatic, no cyanosis or edema, Homans sign is negative, no sign of DVT and no edema, redness or tenderness in the calves or thighs   Assessment/Plan: 41 y.o.   POD# 1. G1P1001                  Principal Problem:   Postpartum care following cesarean delivery Indication: NRFHR (2/7) Active Problems:   Acute blood loss anemia  Mildly symptomatic with dizziness transient.  Oral Fe and Mag oxide ordered  Discussed iron rich  dietary options  Discussed option for blood transfusion if needed; presence of syncope, severe fatigue  Doing well, stable.               Advance diet as tolerated Encourage rest when baby rests Breastfeeding support Encourage to ambulate Routine post-op care  Kathlene Novemberinthya Bowmaker-Kareem, BSN, SNM 04/27/2016, 10:22 AM

## 2016-04-28 MED ORDER — HYDROCORTISONE 1 % EX CREA
TOPICAL_CREAM | Freq: Four times a day (QID) | CUTANEOUS | Status: DC
  Administered 2016-04-28 – 2016-04-29 (×3): via TOPICAL
  Filled 2016-04-28: qty 28

## 2016-04-28 NOTE — Progress Notes (Signed)
Newborn not being discharged by Peds today - will plan maternal discharge in AM  Marlinda Mikeanya Ereka Brau CNM [facility] 04/28/2016 @ 1700

## 2016-04-28 NOTE — Lactation Note (Signed)
This note was copied from a baby's chart. Lactation Consultation Note  Patient Name: Boy Annell GreeningKaren Buckman AOZHY'QToday's Date: 04/28/2016 Reason for consult: Follow-up assessment;Breast/nipple pain  Baby to be made a baby patient due to 1 void in 53 hrs, 3 stools recorded.  Baby circumcised last evening, and no void yet.  Mom has breastfed baby 5 times in last 24 hrs.  Mom sitting in chair with baby in football hold.  Mom slouched in chair.  Baby noted to be sucking on nipple tip.  Asked Mom if this is how baby has been latching, and she stated yes.  Mom states that latches are painful 5/10 pain score.  Removed baby from left breast, and nipple was creased on both sides.  Mom has long, large nipples.  Talked about getting better depth on the breast to ensure baby is transferring milk.  Tried football again on left several times, and then on right side. Assisted with cross cradle hold.  Mom assisted to sandwich breast.  Baby able to open widely with a deep areola latch.  LC noted a much better suck pattern, with occasional swallows identified.  Baby fed for 45 mins per Mom.  Baby came off the breast still hungry.   Currently baby is sleeping in GMOB arms while Mom is double pumping.  Switched from 24 to 27 mm flanges to prevent nipple compression, and discomfort while pumping.  No EBM expressed.  Mom reports positive breast changes in early pregnancy, no risk factors for insufficient milk.  Double pumping will help to stimulate her supply. Parents are getting concerned that baby needs supplementation.  Discussed using SNS at the breast to supplement baby.   LC to assist with next feeding.   Lactation to follow up prn and in am.   Consult Status Consult Status: Follow-up Date: 04/29/16 Follow-up type: In-patient    Judee ClaraSmith, Yailine Ballard E 04/28/2016, 2:31 PM

## 2016-04-28 NOTE — Progress Notes (Signed)
Positive flatus. Patient states she is not dizzy.

## 2016-04-28 NOTE — Lactation Note (Signed)
This note was copied from a baby's chart. Lactation Consultation Note  Patient Name: Vanessa Annell GreeningKaren [name] ZOXWR'UToday's Date: 04/28/2016   RN requested larger syringe for supplementing at the breast. A 2035mL-syringe was provided; parents encouraged not to limit infant's feedings.   Lurline HareRichey, Eulla Kochanowski [facility] 04/28/2016, 9:28 PM

## 2016-04-28 NOTE — Lactation Note (Signed)
This note was copied from a baby's chart. Lactation Consultation Note  Patient Name: Boy Annell GreeningKaren Ashton ZOXWR'UToday's Date: 04/28/2016 Reason for consult: Follow-up assessment;Breast/nipple pain;Difficult latch  Assisted with feeding at 56 hrs old.   Baby latched in cross cradle hold, jaw not widely open, even with adjustments attempted.  Nipple pinched with blistering when baby taken off. Initiated a nipple shield, 24 mm fit, but nipple pinched, so changed to 20 mm size nipple shield.  Following feeding, nipple pulled well into shield and rounded.  Feeding felt more comfortable to Mom. 5 Fr feeding tube and syringe to supplement baby at the breast, using 12 ml Alimentum as first feeding.  Baby appeared to relax his jaw a little and his suck pattern improved.  After 12 ml given, baby appeared contented.  Parents stated that this was first time baby was contented after breastfeeding. Assisted Mom to double pump for 15 min, on initiation cycle.  No colostrum noted.  Encouraged breast massage, and hand expression as well.  Risk factors for delayed milk volume- Labor induction, C/S, 900 ml EBL, and Hgb drop from 13.9 to 7 .9 day 1 post delivery.    Mom and FOB shown how to use Pace Method of bottle feeding if they choose to pump and bottle at a feeding.  Volume parameter handout given.  Baby to take 30-60 ml by bottle if he doesn't go to the breast.  Supplement amount at the breast should increase to 15-20 ml at next feeding, parents aware.  To call for assistance as needed.  Lactation to follow up in am. Consult Status Consult Status: Follow-up Date: 04/29/16 Follow-up type: In-patient    Judee ClaraSmith, Rogenia Werntz E 04/28/2016, 4:51 PM

## 2016-04-28 NOTE — Progress Notes (Signed)
Subjective: POD# 2 Information for the patient's newborn:  Vanessa BeechamRicardo, Boy Vanessa [name] [[id number]][[id number]]  female   circ done    Reports feeling better.   Feeding: breast; concerned inf is not receiving enough colostrum.  Inf has only voided 4 times in 48hr, being followed by peds.  Patient reports tolerating PO.  Breast symptoms:sore and some bleeding from left nipple Pain controlled with ibuprofen and percocet Rash on her abdomen developed overnight.  Initially itching but not any more.  Not spread or gotten worse. Denies HA/SOB/N/V/dizziness. Flatus + and BM x1. She reports vaginal bleeding as normal, without clots.  Feels swelling possibly from Foley catheter. She is ambulating, urinating without difficulty.     Objective:   VS:    Vitals:   04/27/16 0524 04/27/16 0850 04/27/16 1919 04/28/16 0529  BP: 98/60 (!) 101/51 (!) 115/44 (!) 90/45  Pulse: 79 81 85 72  Resp: 18 18 18 18   Temp: 98.3 F (36.8 C) 98.2 F (36.8 C) 98 F (36.7 C) 98.3 F (36.8 C)  TempSrc: Oral Oral Oral Oral  SpO2: 98% 98%    Weight:      Height:        No intake or output data in the 24 hours ending 04/28/16 1231      Recent Labs  04/25/16 1358 04/27/16 0527  WBC 10.4 10.9*  HGB 13.9 7.9*  HCT 40.2 22.6*  PLT 217 137*     Blood type: --/--/B POS (02/06 1400)  Rubella: Immune (07/06 0000)     Physical Exam:  General: alert, cooperative and no distress  Skin: red rash throughout abd, initially itching, not any more CV: Regular rate and rhythm, S1S2 present or without murmur or extra heart sounds Resp: clear Abdomen: soft, nontender, normal bowel sounds Incision: clean, dry and intact Uterine Fundus: firm, below umbilicus, nontender Labia: significant right labia edema Lochia: minimal Ext: 1+ pedal edema, Homans sign is negative, no sign of DVT, redness or tenderness in the calves or thighs    Assessment/Plan: 41 y.o.   POD# 2. [id number]                  Principal Problem:   Postpartum care  following cesarean delivery (2/7)  Encourage rest when baby rests  Breastfeeding support- taught hand expression, signs inf is eating enough, need to increase fluid  intake to produce more milk.  Follow up with lactation consultant this evening.  Encourage to ambulate in hallway.  Routine post-op care Active Problems:   Acute blood loss anemia  Asymptomatic   Oral Fe and Mag oxide supplement  Increase fluid intake   Review iron rich diet  Doing well, stable.                Vanessa [name], BSN, SNM 04/28/2016, 12:31 PM

## 2016-04-29 MED ORDER — OXYCODONE HCL 5 MG PO TABS: 5.0000 mg | ORAL_TABLET | ORAL | 0 refills | Status: DC | PRN

## 2016-04-29 MED ORDER — IBUPROFEN 600 MG PO TABS: 600.0000 mg | ORAL_TABLET | Freq: Four times a day (QID) | ORAL | 0 refills | Status: DC

## 2016-04-29 MED ORDER — MAGNESIUM OXIDE 400 (241.3 MG) MG PO TABS: 400.0000 mg | ORAL_TABLET | Freq: Every day | ORAL | 0 refills | Status: DC

## 2016-04-29 MED ORDER — POLYSACCHARIDE IRON COMPLEX 150 MG PO CAPS: 150.0000 mg | ORAL_CAPSULE | Freq: Two times a day (BID) | ORAL | 0 refills | Status: DC

## 2016-04-29 NOTE — Discharge Summary (Signed)
OB Discharge Summary  Patient Name: Vanessa [name] DOB: 1976/03/14 MRN: [id number]  Date of admission: 04/25/2016  Admitting diagnosis: 41 weeks / Oligiohydramnios Intrauterine pregnancy: [redacted]w[redacted]d       Date of discharge: 04/29/2016    Discharge diagnosis: Term Pregnancy Delivered, Anemia and POD 3 s/p Cesarean section for [facility]      Prenatal history: G1P1001   EDC : 04/19/2016, Alternate EDD Entry  Prenatal care at [facility]  Primary provider : Seymour Bars Prenatal course complicated by oligio  Prenatal Labs: ABO, Rh: --/--/B POS (02/06 1400)  Antibody: NEG (02/06 1358) Rubella: Immune (07/06 0000)   RPR: Non Reactive (02/06 1358)  HBsAg: Negative (07/06 0000)  HIV: Non-reactive (07/06 0000)  GBS: Negative (12/29 0000)                                    Hospital course:  Induction of Labor With Cesarean Section  41 y.o. yo G1P1001 at [redacted]w[redacted]d was admitted to the hospital 04/25/2016 for induction of labor. Patient had a labor course significant for NRFHR. The patient went for cesarean section due to Non-Reassuring FHR, and delivered a Viable infant. Membrane Rupture Time/Date:4:33 AM ,04/26/2016   Details of operation can be found in separate operative Note.  Patient had an uncomplicated postpartum course. She is ambulating, tolerating a regular diet, passing flatus, and urinating well.  Patient is discharged home in stable condition on 04/29/16.                                   Delivering PROVIDER: Seymour Bars, MARIE-LYNE                                                            Complications: None  Newborn Data: Live born female  Birth Weight: 7 lb 1.2 oz (3210 g) APGAR: 9, 9  Baby Feeding: Breast Disposition:home with mother  Post partum procedures:none  Postpartum contraception: Not Discussed    Labs: Lab Results  Component Value Date   WBC 10.9 (H) 04/27/2016   HGB 7.9 (L) 04/27/2016   HCT 22.6 (L) 04/27/2016   MCV 90.8 04/27/2016   PLT 137 (L)  04/27/2016   No flowsheet data found.  Physical Exam @ time of discharge:  Vitals:   04/27/16 1919 04/28/16 0529 04/28/16 1838 04/29/16 0606  BP: (!) 115/44 (!) 90/45 111/64 120/67  Pulse: 85 72 74 75  Resp: 18 18 19 18   Temp: 98 F (36.7 C) 98.3 F (36.8 C) 97.7 F (36.5 C) 97.5 F (36.4 C)  TempSrc: Oral Oral Oral   SpO2:      Weight:      Height:        General: alert, cooperative and no distress Lochia: appropriate Uterine Fundus: firm Perineum: intact with moderate edema Incision: Healing well with no significant drainage Extremities: DVT Evaluation: No evidence of DVT seen on physical exam.   Discharge instructions:  "Baby and Me Booklet" and [facility] Booklet  Discharge Medications:  Allergies as of 04/29/2016      Reactions   Fluconazole Nausea And Vomiting, Other (See Comments)   thrush  Medication List    TAKE these medications   ibuprofen 600 MG tablet Commonly known as:  ADVIL,MOTRIN Take 1 tablet (600 mg total) by mouth every 6 (six) hours.   iron polysaccharides 150 MG capsule Commonly known as:  NIFEREX Take 1 capsule (150 mg total) by mouth 2 (two) times daily.   magnesium oxide 400 (241.3 Mg) MG tablet Commonly known as:  MAG-OX Take 1 tablet (400 mg total) by mouth daily. Start taking on:  04/30/2016   oxyCODONE 5 MG immediate release tablet Commonly known as:  Oxy IR/ROXICODONE Take 1 tablet (5 mg total) by mouth every 4 (four) hours as needed (pain scale 4-7).   prenatal multivitamin Tabs tablet Take 1 tablet by mouth at bedtime.       Diet: routine diet  Activity: Advance as tolerated. Pelvic rest x 6 weeks.   Follow up:6 weeks    Signed: Marlinda MikeBAILEY, Islay Polanco CNM, MSN, [facility]FACNM 04/29/2016, 11:51 AM

## 2016-04-29 NOTE — Lactation Note (Signed)
This note was copied from a baby's chart. Lactation Consultation Note  Mother states during the night w/ nipple shield and SNS she started bleeding on L side and R side is too sore to latch. Large diameter nipples. Noted crack on tip of L nipple.  No trauma noted on R side. Family switched to bottles during the night.  Tried the larger syringe but it did not push well.  Suggest prefilling 2 smaller syringes and switching out. Mother states she plans to pump once she gets home.  Discussed supply and demand and recommend not waiting and the importance of stimulating your milk supply. Reviewed engorgement care and monitoring voids/stools. Provided family with another #20NS and another SNS set up. Mother states on Monday they have an appt w/ Barb Carder. Encouraged STS and attempting to breastfeed again once nipples feel better. Mother has been applying coconut oil  Encouraged applying ebm also. Reviewed volume guidelines and suggest family call if they need further assistance.  Patient Name: Vanessa Annell GreeningKaren Slinger [name]'UToday's Date: 04/29/2016 Reason for consult: Follow-up assessment   Maternal Data    Feeding    LATCH Score/Interventions Latch:  (choosing to not BR feed, did not pump @ noc, adv to pump)                    Lactation Tools Discussed/Used     Consult Status Consult Status: Complete    Hardie PulleyBerkelhammer, Alvaro Aungst Boschen 04/29/2016, 10:00 AM

## 2016-04-29 NOTE — Discharge Instructions (Signed)
Iron-Rich Diet  Introduction Iron is a mineral that helps your body to produce hemoglobin. Hemoglobin is a protein in your red blood cells that carries oxygen to your body's tissues. Eating too little iron may cause you to feel weak and tired, and it can increase your risk for infection. Eating enough iron is necessary for your body's metabolism, muscle function, and nervous system. Iron is naturally found in many foods. It can also be added to foods or fortified in foods. There are two types of dietary iron:  Heme iron. Heme iron is absorbed by the body more easily than nonheme iron. Heme iron is found in meat, poultry, and fish.  Nonheme iron. Nonheme iron is found in dietary supplements, iron-fortified grains, beans, and vegetables. You may need to follow an iron-rich diet if:  You have been diagnosed with iron deficiency or iron-deficiency anemia.  You have a condition that prevents you from absorbing dietary iron, such as:  Infection in your intestines.  Celiac disease. This involves long-lasting (chronic) inflammation of your intestines.  You do not eat enough iron.  You eat a diet that is high in foods that impair iron absorption.  You have lost a lot of blood.  You have heavy bleeding during your menstrual cycle.  You are pregnant. What is my plan? Your health care provider may help you to determine how much iron you need per day based on your condition. Generally, when a person consumes sufficient amounts of iron in the diet, the following iron needs are met:  Men.  58-30 years old: 11 mg per day.  39-5 years old: 8 mg per day.  Women.  51-53 years old: 15 mg per day.  97-38 years old: 18 mg per day.  Over 4 years old: 8 mg per day.  Pregnant women: 27 mg per day.  Breastfeeding women: 9 mg per day. What do I need to know about an iron-rich diet?  Eat fresh fruits and vegetables that are high in vitamin C along with foods that are high in iron. This will  help increase the amount of iron that your body absorbs from food, especially with foods containing nonheme iron. Foods that are high in vitamin C include oranges, peppers, tomatoes, and mango.  Take iron supplements only as directed by your health care provider. Overdose of iron can be life-threatening. If you were prescribed iron supplements, take them with orange juice or a vitamin C supplement.  Cook foods in pots and pans that are made from iron.  Eat nonheme iron-containing foods alongside foods that are high in heme iron. This helps to improve your iron absorption.  Certain foods and drinks contain compounds that impair iron absorption. Avoid eating these foods in the same meal as iron-rich foods or with iron supplements. These include:  Coffee, black tea, and red wine.  Milk, dairy products, and foods that are high in calcium.  Beans, soybeans, and peas.  Whole grains.  When eating foods that contain both nonheme iron and compounds that impair iron absorption, follow these tips to absorb iron better.  Soak beans overnight before cooking.  Soak whole grains overnight and drain them before using.  Ferment flours before baking, such as using yeast in bread dough. What foods can I eat? Grains  Iron-fortified breakfast cereal. Iron-fortified whole-wheat bread. Enriched rice. Sprouted grains. Vegetables  Spinach. Potatoes with skin. Green peas. Broccoli. Red and green bell peppers. Fermented vegetables. Fruits  Prunes. Raisins. Oranges. Strawberries. Mango. Grapefruit. Meats and Other  Protein Sources  Beef liver. Oysters. Beef. Shrimp. [place]. Chicken. [place]. Sardines. Chickpeas. Nuts. Tofu. Beverages  Tomato juice. Fresh orange juice. Prune juice. Hibiscus tea. Fortified instant breakfast shakes. Condiments  Tahini. Fermented soy sauce. Sweets and Desserts  Black-strap molasses. Other  Wheat germ. The items listed above may not be a complete list of recommended foods or  beverages. Contact your dietitian for more options.  What foods are not recommended? Grains  Whole grains. Bran cereal. Bran flour. Oats. Vegetables  Artichokes. Brussels sprouts. Kale. Fruits  Blueberries. Raspberries. Strawberries. Figs. Meats and Other Protein Sources  Soybeans. Products made from soy protein. Dairy  Milk. Cream. Cheese. Yogurt. Cottage cheese. Beverages  Coffee. Black tea. Red wine. Sweets and Desserts  Cocoa. Chocolate. Ice cream. Other  Basil. Oregano. Parsley. The items listed above may not be a complete list of foods and beverages to avoid. Contact your dietitian for more information.  This information is not intended to replace advice given to you by your health care provider. Make sure you discuss any questions you have with your health care provider. Document Released: 10/18/2004 Document Revised: 09/24/2015 Document Reviewed: 10/01/2013  2017 Elsevier

## 2016-04-29 NOTE — Progress Notes (Signed)
POST3OPERATIVE DAY # 3 S/P CS   S:         Reports feeling ok - still sore and some swelling             Tolerating po intake / no nausea / no vomiting / + flatus / no BM             Bleeding is light             Pain controlled with motrin and oxycodone             Up ad lib / ambulatory/ voiding QS  Newborn breast feeding    O:  VS: BP 120/67   Pulse 75   Temp 97.5 F (36.4 C)   Resp 18   Ht 5\' 1"  (1.549 m)   Wt 64.2 kg (141 lb 8 oz)   SpO2 98%   Breastfeeding? Unknown   BMI 26.74 kg/m    LABS:               Recent Labs  04/27/16 0527  WBC 10.9*  HGB 7.9*  PLT 137*               Bloodtype: --/--/B POS (02/06 1400)  Rubella: Immune (07/06 0000)                                 Physical Exam:             Alert and Oriented X3  Lungs: Clear and unlabored  Heart: regular rate and rhythm / no mumurs  Abdomen: soft, non-tender, non-distended              Fundus: firm, non-tender, U-1             Dressing intact              Incision:  approximated with suture / no erythema / no ecchymosis / no drainage  Perineum: intact with moderate edema  Lochia: light  Extremities: trace edema, no calf pain or tenderness  A:        POD # 3 S/P CS            IDA  P:        Routine postoperative care              DC home             WOB booklet - instructions reviewed    Marlinda MikeBAILEY, TANYA CNM, MSN, [facility] 04/29/2016, 10:42 AM

## 2016-06-28 DIAGNOSIS — Z0000 Encounter for general adult medical examination without abnormal findings: Secondary | ICD-10-CM | POA: Diagnosis not present

## 2016-06-28 DIAGNOSIS — Z13220 Encounter for screening for lipoid disorders: Secondary | ICD-10-CM | POA: Diagnosis not present

## 2016-07-21 ENCOUNTER — Telehealth: Payer: Self-pay

## 2016-07-21 NOTE — Telephone Encounter (Signed)
Patient asked if we could provide her with note okaying her to return to work after C/S on 04/26/16. Her paper chart is on your desk. thanks

## 2016-07-21 NOTE — Telephone Encounter (Signed)
Yes, agree, please write a return to work note at 12 weeks post op C/S.

## 2016-07-21 NOTE — Telephone Encounter (Signed)
Left message for patient to call me with return to work date (not sure if she had returned or when she plans to.).

## 2016-07-21 NOTE — Telephone Encounter (Signed)
Patient called back. Said she returned to work on 07/15/26 employer needs note stating that is ok. Letter sent to employer.

## 2016-08-25 NOTE — Addendum Note (Signed)
Addendum  created 08/25/16 0950 by Delane Stalling, MD   Sign clinical note    

## 2016-09-15 DIAGNOSIS — M654 Radial styloid tenosynovitis [de Quervain]: Secondary | ICD-10-CM | POA: Diagnosis not present

## 2016-09-15 DIAGNOSIS — K921 Melena: Secondary | ICD-10-CM | POA: Diagnosis not present

## 2017-04-19 DIAGNOSIS — R079 Chest pain, unspecified: Secondary | ICD-10-CM | POA: Diagnosis not present

## 2017-04-19 DIAGNOSIS — M479 Spondylosis, unspecified: Secondary | ICD-10-CM | POA: Diagnosis not present

## 2017-04-19 DIAGNOSIS — R195 Other fecal abnormalities: Secondary | ICD-10-CM | POA: Diagnosis not present

## 2017-04-19 DIAGNOSIS — R911 Solitary pulmonary nodule: Secondary | ICD-10-CM | POA: Diagnosis not present

## 2017-04-20 ENCOUNTER — Other Ambulatory Visit: Payer: Self-pay | Admitting: Family Medicine

## 2017-04-20 ENCOUNTER — Ambulatory Visit
Admission: RE | Admit: 2017-04-20 | Discharge: 2017-04-20 | Disposition: A | Discharge status: Home / Self Care | Payer: BLUE CROSS/BLUE SHIELD | Source: Ambulatory Visit | Attending: Family Medicine | Admitting: Family Medicine

## 2017-04-20 DIAGNOSIS — R911 Solitary pulmonary nodule: Secondary | ICD-10-CM

## 2017-04-20 DIAGNOSIS — R079 Chest pain, unspecified: Secondary | ICD-10-CM | POA: Diagnosis not present

## 2017-04-23 DIAGNOSIS — M479 Spondylosis, unspecified: Secondary | ICD-10-CM | POA: Diagnosis not present

## 2017-04-23 DIAGNOSIS — E559 Vitamin D deficiency, unspecified: Secondary | ICD-10-CM | POA: Diagnosis not present

## 2017-06-08 ENCOUNTER — Encounter: Payer: Self-pay | Admitting: Obstetrics & Gynecology

## 2017-06-08 ENCOUNTER — Ambulatory Visit: Payer: BLUE CROSS/BLUE SHIELD | Admitting: Obstetrics & Gynecology

## 2017-06-08 VITALS — BP 112/70 | Ht 61.0 in | Wt 115.0 lb

## 2017-06-08 DIAGNOSIS — Z1151 Encounter for screening for human papillomavirus (HPV): Secondary | ICD-10-CM | POA: Diagnosis not present

## 2017-06-08 DIAGNOSIS — Z01419 Encounter for gynecological examination (general) (routine) without abnormal findings: Secondary | ICD-10-CM

## 2017-06-08 DIAGNOSIS — Z3169 Encounter for other general counseling and advice on procreation: Secondary | ICD-10-CM

## 2017-06-08 DIAGNOSIS — N914 Secondary oligomenorrhea: Secondary | ICD-10-CM

## 2017-06-08 LAB — CBC
HCT: 41 % (ref 35.0–45.0)
Hemoglobin: 14.5 g/dL (ref 11.7–15.5)
MCH: 30.5 pg (ref 27.0–33.0)
MCHC: 35.4 g/dL (ref 32.0–36.0)
MCV: 86.3 fL (ref 80.0–100.0)
MPV: 9 fL (ref 7.5–12.5)
PLATELETS: 322 10*3/uL (ref 140–400)
RBC: 4.75 10*6/uL (ref 3.80–5.10)
RDW: 11.3 % (ref 11.0–15.0)
WBC: 9.1 10*3/uL (ref 3.8–10.8)

## 2017-06-08 LAB — TSH: TSH: 0.58 m[IU]/L

## 2017-06-08 NOTE — Patient Instructions (Signed)
1. Encounter for routine gynecological examination with Papanicolaou smear of cervix Normal gynecologic exam.  Pap with high risk HPV done today.  Breast exam normal.  Will start screening mammograms at 42.  Health labs with Dr. Stephanie Acre, family MD. - CBC  2. Secondary oligomenorrhea Menstrual cycles every 4-6 weeks.  Will rule out thyroid dysfunction. - TSH  3. Encounter for preconception consultation Ready to attempt conception again.  Advanced maternal age at 42.  Menstrual cycles every 4-6 weeks.  Recommended attempting conception with timed intercourse as soon as possible.  Will use an ovulatory kit, LH urine testing to help time ovulation.  If not successful by 6 months will follow up with me for evaluation and probably clomiphene stimulation.  Continue on prenatal vitamins.  Management of delivery discussed with patient, chances of vaginal delivery after C-section versus repeat C-section reviewed.  Decreased fertility at age 42 also discussed.  Patient was severely anemic after the C-section with a hemoglobin at 7.9.  Will do a CBC today to evaluate.  Jennie, it was a pleasure seeing you today!  I will inform you of your results as soon as they are available.  Health Maintenance, Female Adopting a healthy lifestyle and getting preventive care can go a long way to promote health and wellness. Talk with your health care provider about what schedule of regular examinations is right for you. This is a good chance for you to check in with your provider about disease prevention and staying healthy. In between checkups, there are plenty of things you can do on your own. Experts have done a lot of research about which lifestyle changes and preventive measures are most likely to keep you healthy. Ask your health care provider for more information. Weight and diet Eat a healthy diet  Be sure to include plenty of vegetables, fruits, low-fat dairy products, and lean protein.  Do not eat a lot of foods  high in solid fats, added sugars, or salt.  Get regular exercise. This is one of the most important things you can do for your health. ? Most adults should exercise for at least 150 minutes each week. The exercise should increase your heart rate and make you sweat (moderate-intensity exercise). ? Most adults should also do strengthening exercises at least twice a week. This is in addition to the moderate-intensity exercise.  Maintain a healthy weight  Body mass index (BMI) is a measurement that can be used to identify possible weight problems. It estimates body fat based on height and weight. Your health care provider can help determine your BMI and help you achieve or maintain a healthy weight.  For females 22 years of age and older: ? A BMI below 18.5 is considered underweight. ? A BMI of 18.5 to 24.9 is normal. ? A BMI of 25 to 29.9 is considered overweight. ? A BMI of 30 and above is considered obese.  Watch levels of cholesterol and blood lipids  You should start having your blood tested for lipids and cholesterol at 40 years of age, then have this test every 5 years.  You may need to have your cholesterol levels checked more often if: ? Your lipid or cholesterol levels are high. ? You are older than 40 years of age. ? You are at high risk for heart disease.  Cancer screening Lung Cancer  Lung cancer screening is recommended for adults 62-39 years old who are at high risk for lung cancer because of a history of smoking.  A  yearly low-dose CT scan of the lungs is recommended for people who: ? Currently smoke. ? Have quit within the past 15 years. ? Have at least a 30-pack-year history of smoking. A pack year is smoking an average of one pack of cigarettes a day for 1 year.  Yearly screening should continue until it has been 15 years since you quit.  Yearly screening should stop if you develop a health problem that would prevent you from having lung cancer treatment.  Breast  Cancer  Practice breast self-awareness. This means understanding how your breasts normally appear and feel.  It also means doing regular breast self-exams. Let your health care provider know about any changes, no matter how small.  If you are in your 20s or 30s, you should have a clinical breast exam (CBE) by a health care provider every 1-3 years as part of a regular health exam.  If you are 6 or older, have a CBE every year. Also consider having a breast X-ray (mammogram) every year.  If you have a family history of breast cancer, talk to your health care provider about genetic screening.  If you are at high risk for breast cancer, talk to your health care provider about having an MRI and a mammogram every year.  Breast cancer gene (BRCA) assessment is recommended for women who have family members with BRCA-related cancers. BRCA-related cancers include: ? Breast. ? Ovarian. ? Tubal. ? Peritoneal cancers.  Results of the assessment will determine the need for genetic counseling and BRCA1 and BRCA2 testing.  Cervical Cancer Your health care provider may recommend that you be screened regularly for cancer of the pelvic organs (ovaries, uterus, and vagina). This screening involves a pelvic examination, including checking for microscopic changes to the surface of your cervix (Pap test). You may be encouraged to have this screening done every 3 years, beginning at age 51.  For women ages 6-65, health care providers may recommend pelvic exams and Pap testing every 3 years, or they may recommend the Pap and pelvic exam, combined with testing for human papilloma virus (HPV), every 5 years. Some types of HPV increase your risk of cervical cancer. Testing for HPV may also be done on women of any age with unclear Pap test results.  Other health care providers may not recommend any screening for nonpregnant women who are considered low risk for pelvic cancer and who do not have symptoms. Ask your  health care provider if a screening pelvic exam is right for you.  If you have had past treatment for cervical cancer or a condition that could lead to cancer, you need Pap tests and screening for cancer for at least 20 years after your treatment. If Pap tests have been discontinued, your risk factors (such as having a new sexual partner) need to be reassessed to determine if screening should resume. Some women have medical problems that increase the chance of getting cervical cancer. In these cases, your health care provider may recommend more frequent screening and Pap tests.  Colorectal Cancer  This type of cancer can be detected and often prevented.  Routine colorectal cancer screening usually begins at 40 years of age and continues through 40 years of age.  Your health care provider may recommend screening at an earlier age if you have risk factors for colon cancer.  Your health care provider may also recommend using home test kits to check for hidden blood in the stool.  A small camera at the end of  a tube can be used to examine your colon directly (sigmoidoscopy or colonoscopy). This is done to check for the earliest forms of colorectal cancer.  Routine screening usually begins at age 64.  Direct examination of the colon should be repeated every 5-10 years through 40 years of age. However, you may need to be screened more often if early forms of precancerous polyps or small growths are found.  Skin Cancer  Check your skin from head to toe regularly.  Tell your health care provider about any new moles or changes in moles, especially if there is a change in a mole's shape or color.  Also tell your health care provider if you have a mole that is larger than the size of a pencil eraser.  Always use sunscreen. Apply sunscreen liberally and repeatedly throughout the day.  Protect yourself by wearing long sleeves, pants, a wide-brimmed hat, and sunglasses whenever you are  outside.  Heart disease, diabetes, and high blood pressure  High blood pressure causes heart disease and increases the risk of stroke. High blood pressure is more likely to develop in: ? People who have blood pressure in the high end of the normal range (130-139/85-89 mm Hg). ? People who are overweight or obese. ? People who are African American.  If you are 27-62 years of age, have your blood pressure checked every 3-5 years. If you are 71 years of age or older, have your blood pressure checked every year. You should have your blood pressure measured twice-once when you are at a hospital or clinic, and once when you are not at a hospital or clinic. Record the average of the two measurements. To check your blood pressure when you are not at a hospital or clinic, you can use: ? An automated blood pressure machine at a pharmacy. ? A home blood pressure monitor.  If you are between 15 years and 26 years old, ask your health care provider if you should take aspirin to prevent strokes.  Have regular diabetes screenings. This involves taking a blood sample to check your fasting blood sugar level. ? If you are at a normal weight and have a low risk for diabetes, have this test once every three years after 40 years of age. ? If you are overweight and have a high risk for diabetes, consider being tested at a younger age or more often. Preventing infection Hepatitis B  If you have a higher risk for hepatitis B, you should be screened for this virus. You are considered at high risk for hepatitis B if: ? You were born in a country where hepatitis B is common. Ask your health care provider which countries are considered high risk. ? Your parents were born in a high-risk country, and you have not been immunized against hepatitis B (hepatitis B vaccine). ? You have HIV or AIDS. ? You use needles to inject street drugs. ? You live with someone who has hepatitis B. ? You have had sex with someone who has  hepatitis B. ? You get hemodialysis treatment. ? You take certain medicines for conditions, including cancer, organ transplantation, and autoimmune conditions.  Hepatitis C  Blood testing is recommended for: ? Everyone born from [birth year] through 1963. ? Anyone with known risk factors for hepatitis C.  Sexually transmitted infections (STIs)  You should be screened for sexually transmitted infections (STIs) including gonorrhea and chlamydia if: ? You are sexually active and are younger than 39 years of age. ? You are older  than 40 years of age and your health care provider tells you that you are at risk for this type of infection. ? Your sexual activity has changed since you were last screened and you are at an increased risk for chlamydia or gonorrhea. Ask your health care provider if you are at risk.  If you do not have HIV, but are at risk, it may be recommended that you take a prescription medicine daily to prevent HIV infection. This is called pre-exposure prophylaxis (PrEP). You are considered at risk if: ? You are sexually active and do not regularly use condoms or know the HIV status of your partner(s). ? You take drugs by injection. ? You are sexually active with a partner who has HIV.  Talk with your health care provider about whether you are at high risk of being infected with HIV. If you choose to begin PrEP, you should first be tested for HIV. You should then be tested every 3 months for as long as you are taking PrEP. Pregnancy  If you are premenopausal and you may become pregnant, ask your health care provider about preconception counseling.  If you may become pregnant, take 400 to 800 micrograms (mcg) of folic acid every day.  If you want to prevent pregnancy, talk to your health care provider about birth control (contraception). Osteoporosis and menopause  Osteoporosis is a disease in which the bones lose minerals and strength with aging. This can result in serious bone  fractures. Your risk for osteoporosis can be identified using a bone density scan.  If you are 1 years of age or older, or if you are at risk for osteoporosis and fractures, ask your health care provider if you should be screened.  Ask your health care provider whether you should take a calcium or vitamin D supplement to lower your risk for osteoporosis.  Menopause may have certain physical symptoms and risks.  Hormone replacement therapy may reduce some of these symptoms and risks. Talk to your health care provider about whether hormone replacement therapy is right for you. Follow these instructions at home:  Schedule regular health, dental, and eye exams.  Stay current with your immunizations.  Do not use any tobacco products including cigarettes, chewing tobacco, or electronic cigarettes.  If you are pregnant, do not drink alcohol.  If you are breastfeeding, limit how much and how often you drink alcohol.  Limit alcohol intake to no more than 1 drink per day for nonpregnant women. One drink equals 12 ounces of beer, 5 ounces of wine, or 1 ounces of hard liquor.  Do not use street drugs.  Do not share needles.  Ask your health care provider for help if you need support or information about quitting drugs.  Tell your health care provider if you often feel depressed.  Tell your health care provider if you have ever been abused or do not feel safe at home. This information is not intended to replace advice given to you by your health care provider. Make sure you discuss any questions you have with your health care provider. Document Released: 09/19/2010 Document Revised: 08/12/2015 Document Reviewed: 12/08/2014 Elsevier Interactive Patient Education  [employer].

## 2017-06-08 NOTE — Progress Notes (Signed)
Vanessa [name] Jun 10, 1975 [id number]   History:    42 y.o. G1P1L1  Married.  Son is 52 yo, doing very well.  C/S for arrest of progression/FHR non-reassuring.  Patient's mother died just after the birth of her son last year.  RP:  Established patient presenting for annual gyn exam   HPI: Menses normal flow every 4-6 weeks.  No BTB.  No pelvic pain. Normal vaginal secretions.  Abstinent x 1 year, since the birth of her son.  Would like to conceive again now.  On PNVs.  Breasts wnl.  Urine/BMs wnl.  BMI 21.73.  Health labs with Fam MD, Dr Stephanie Acre.  Past medical history,surgical history, family history and social history were all reviewed and documented in the EPIC chart.  Gynecologic History Patient's last menstrual period was 04/24/2017. Contraception: abstinence Last Pap: 2016. Results were: Negative Last mammogram: Never Bone Density: Never Colonoscopy: Never  Obstetric History OB History  Gravida Para Term Preterm AB Living  '1 1 1     1  '$ SAB TAB Ectopic Multiple Live Births        0 1    # Outcome Date GA Lbr Len/2nd Weight Sex Delivery Anes PTL Lv  1 Term 04/26/16 [redacted]w[redacted]d 7 lb 1.2 oz (3.21 kg) M CS-LTranv EPI  LIV     ROS: A ROS was performed and pertinent positives and negatives are included in the history.  GENERAL: No fevers or chills. HEENT: No change in vision, no earache, sore throat or sinus congestion. NECK: No pain or stiffness. CARDIOVASCULAR: No chest pain or pressure. No palpitations. PULMONARY: No shortness of breath, cough or wheeze. GASTROINTESTINAL: No abdominal pain, nausea, vomiting or diarrhea, melena or bright red blood per rectum. GENITOURINARY: No urinary frequency, urgency, hesitancy or dysuria. MUSCULOSKELETAL: No joint or muscle pain, no back pain, no recent trauma. DERMATOLOGIC: No rash, no itching, no lesions. ENDOCRINE: No polyuria, polydipsia, no heat or cold intolerance. No recent change in weight. HEMATOLOGICAL: No anemia or easy bruising or  bleeding. NEUROLOGIC: No headache, seizures, numbness, tingling or weakness. PSYCHIATRIC: No depression, no loss of interest in normal activity or change in sleep pattern.     Exam:   Ht '5\' 1"'$  (1.549 m)   Wt 115 lb (52.2 kg)   LMP 04/24/2017 Comment: no birth control   BMI 21.73 kg/m   Body mass index is 21.73 kg/m.  General appearance : Well developed well nourished female. No acute distress HEENT: Eyes: no retinal hemorrhage or exudates,  Neck supple, trachea midline, no carotid bruits, no thyroidmegaly Lungs: Clear to auscultation, no rhonchi or wheezes, or rib retractions  Heart: Regular rate and rhythm, no murmurs or gallops Breast:Examined in sitting and supine position were symmetrical in appearance, no palpable masses or tenderness,  no skin retraction, no nipple inversion, no nipple discharge, no skin discoloration, no axillary or supraclavicular lymphadenopathy Abdomen: no palpable masses or tenderness, no rebound or guarding Extremities: no edema or skin discoloration or tenderness  Pelvic: Vulva: Normal             Vagina: No gross lesions or discharge  Cervix: No gross lesions or discharge.  Pap/HR HPV done.  Uterus AV, normal size, shape and consistency, non-tender and mobile  Adnexa  Without masses or tenderness  Anus: Normal   Assessment/Plan:  42y.o. female for annual exam   1. Encounter for routine gynecological examination with Papanicolaou smear of cervix Normal gynecologic exam.  Pap with high risk HPV done today.  Breast exam normal.  Will start screening mammograms at 42.  Health labs with Dr. Stephanie Acre, family MD. - CBC  2. Secondary oligomenorrhea Menstrual cycles every 4-6 weeks.  Will rule out thyroid dysfunction. - TSH  3. Encounter for preconception consultation Ready to attempt conception again.  Advanced maternal age at 62.  Menstrual cycles every 4-6 weeks.  Recommended attempting conception with timed intercourse as soon as possible.  Will use  an ovulatory kit, LH urine testing to help time ovulation.  If not successful by 6 months will follow up with me for evaluation and probably clomiphene stimulation.  Continue on prenatal vitamins.  Management of delivery discussed with patient, chances of vaginal delivery after C-section versus repeat C-section reviewed.  Decreased fertility at age 32 also discussed.  Patient was severely anemic after the C-section with a hemoglobin at 7.9.  Will do a CBC today to evaluate.  Counseling on above issues and coordination of care more than 50% for 10 minutes.  Princess Bruins MD, 8:48 AM 06/08/2017

## 2017-06-08 NOTE — Addendum Note (Signed)
Addended by: Berna SpareASTILLO, BLANCA A on: 06/08/2017 09:21 AM   Modules accepted: Orders

## 2017-06-11 LAB — PAP, TP IMAGING W/ HPV RNA, RFLX HPV TYPE 16,18/45: HPV DNA High Risk: NOT DETECTED

## 2017-07-09 ENCOUNTER — Telehealth: Payer: Self-pay | Admitting: *Deleted

## 2017-07-09 DIAGNOSIS — Z13220 Encounter for screening for lipoid disorders: Secondary | ICD-10-CM | POA: Diagnosis not present

## 2017-07-09 DIAGNOSIS — E559 Vitamin D deficiency, unspecified: Secondary | ICD-10-CM | POA: Diagnosis not present

## 2017-07-09 DIAGNOSIS — Z0000 Encounter for general adult medical examination without abnormal findings: Secondary | ICD-10-CM | POA: Diagnosis not present

## 2017-07-09 NOTE — Telephone Encounter (Signed)
Pt called back requesting pap smear results from 06/08/17 visit. I called pt and got her voicemail, I left on voicemail that Vanessa MessierKathy called and left a detailed message that her pap was normal and HPV as well. On 06/13/17

## 2017-12-07 DIAGNOSIS — Z3201 Encounter for pregnancy test, result positive: Secondary | ICD-10-CM | POA: Diagnosis not present

## 2017-12-24 DIAGNOSIS — Z3201 Encounter for pregnancy test, result positive: Secondary | ICD-10-CM | POA: Diagnosis not present

## 2017-12-31 DIAGNOSIS — Z3689 Encounter for other specified antenatal screening: Secondary | ICD-10-CM | POA: Diagnosis not present

## 2017-12-31 DIAGNOSIS — O09521 Supervision of elderly multigravida, first trimester: Secondary | ICD-10-CM | POA: Diagnosis not present

## 2017-12-31 DIAGNOSIS — Z3A09 9 weeks gestation of pregnancy: Secondary | ICD-10-CM | POA: Diagnosis not present

## 2017-12-31 LAB — OB RESULTS CONSOLE ABO/RH: RH Type: POSITIVE

## 2017-12-31 LAB — OB RESULTS CONSOLE HIV ANTIBODY (ROUTINE TESTING): HIV: NONREACTIVE

## 2017-12-31 LAB — OB RESULTS CONSOLE RPR: RPR: NONREACTIVE

## 2017-12-31 LAB — OB RESULTS CONSOLE HEPATITIS B SURFACE ANTIGEN: Hepatitis B Surface Ag: NEGATIVE

## 2017-12-31 LAB — OB RESULTS CONSOLE RUBELLA ANTIBODY, IGM: Rubella: IMMUNE

## 2017-12-31 LAB — OB RESULTS CONSOLE ANTIBODY SCREEN: Antibody Screen: NEGATIVE

## 2018-01-10 DIAGNOSIS — Z113 Encounter for screening for infections with a predominantly sexual mode of transmission: Secondary | ICD-10-CM | POA: Diagnosis not present

## 2018-01-10 DIAGNOSIS — Z3A10 10 weeks gestation of pregnancy: Secondary | ICD-10-CM | POA: Diagnosis not present

## 2018-01-10 DIAGNOSIS — Z1151 Encounter for screening for human papillomavirus (HPV): Secondary | ICD-10-CM | POA: Diagnosis not present

## 2018-01-10 DIAGNOSIS — Z01419 Encounter for gynecological examination (general) (routine) without abnormal findings: Secondary | ICD-10-CM | POA: Diagnosis not present

## 2018-01-10 DIAGNOSIS — O09521 Supervision of elderly multigravida, first trimester: Secondary | ICD-10-CM | POA: Diagnosis not present

## 2018-01-10 DIAGNOSIS — O26893 Other specified pregnancy related conditions, third trimester: Secondary | ICD-10-CM | POA: Diagnosis not present

## 2018-01-10 DIAGNOSIS — Z118 Encounter for screening for other infectious and parasitic diseases: Secondary | ICD-10-CM | POA: Diagnosis not present

## 2018-01-10 DIAGNOSIS — Z124 Encounter for screening for malignant neoplasm of cervix: Secondary | ICD-10-CM | POA: Diagnosis not present

## 2018-01-10 DIAGNOSIS — N898 Other specified noninflammatory disorders of vagina: Secondary | ICD-10-CM | POA: Diagnosis not present

## 2018-02-05 DIAGNOSIS — Z3A14 14 weeks gestation of pregnancy: Secondary | ICD-10-CM | POA: Diagnosis not present

## 2018-02-05 DIAGNOSIS — Z23 Encounter for immunization: Secondary | ICD-10-CM | POA: Diagnosis not present

## 2018-02-05 DIAGNOSIS — O09522 Supervision of elderly multigravida, second trimester: Secondary | ICD-10-CM | POA: Diagnosis not present

## 2018-02-20 DIAGNOSIS — Z361 Encounter for antenatal screening for raised alphafetoprotein level: Secondary | ICD-10-CM | POA: Diagnosis not present

## 2018-03-08 DIAGNOSIS — O4402 Placenta previa specified as without hemorrhage, second trimester: Secondary | ICD-10-CM | POA: Diagnosis not present

## 2018-03-08 DIAGNOSIS — O09522 Supervision of elderly multigravida, second trimester: Secondary | ICD-10-CM | POA: Diagnosis not present

## 2018-03-08 DIAGNOSIS — Z3A19 19 weeks gestation of pregnancy: Secondary | ICD-10-CM | POA: Diagnosis not present

## 2018-03-29 DIAGNOSIS — Z3A22 22 weeks gestation of pregnancy: Secondary | ICD-10-CM | POA: Diagnosis not present

## 2018-03-29 DIAGNOSIS — O4402 Placenta previa specified as without hemorrhage, second trimester: Secondary | ICD-10-CM | POA: Diagnosis not present

## 2018-03-29 DIAGNOSIS — O09522 Supervision of elderly multigravida, second trimester: Secondary | ICD-10-CM | POA: Diagnosis not present

## 2018-04-05 DIAGNOSIS — O09522 Supervision of elderly multigravida, second trimester: Secondary | ICD-10-CM | POA: Diagnosis not present

## 2018-04-05 DIAGNOSIS — Z3A23 23 weeks gestation of pregnancy: Secondary | ICD-10-CM | POA: Diagnosis not present

## 2018-04-05 DIAGNOSIS — O4402 Placenta previa specified as without hemorrhage, second trimester: Secondary | ICD-10-CM | POA: Diagnosis not present

## 2018-05-07 DIAGNOSIS — O26892 Other specified pregnancy related conditions, second trimester: Secondary | ICD-10-CM | POA: Diagnosis not present

## 2018-05-07 DIAGNOSIS — Z3689 Encounter for other specified antenatal screening: Secondary | ICD-10-CM | POA: Diagnosis not present

## 2018-05-07 DIAGNOSIS — B373 Candidiasis of vulva and vagina: Secondary | ICD-10-CM | POA: Diagnosis not present

## 2018-05-07 DIAGNOSIS — Z3A27 27 weeks gestation of pregnancy: Secondary | ICD-10-CM | POA: Diagnosis not present

## 2018-05-07 DIAGNOSIS — O4402 Placenta previa specified as without hemorrhage, second trimester: Secondary | ICD-10-CM | POA: Diagnosis not present

## 2018-05-24 DIAGNOSIS — Z3A30 30 weeks gestation of pregnancy: Secondary | ICD-10-CM | POA: Diagnosis not present

## 2018-05-24 DIAGNOSIS — Z23 Encounter for immunization: Secondary | ICD-10-CM | POA: Diagnosis not present

## 2018-05-24 DIAGNOSIS — O09523 Supervision of elderly multigravida, third trimester: Secondary | ICD-10-CM | POA: Diagnosis not present

## 2018-05-24 DIAGNOSIS — Z3689 Encounter for other specified antenatal screening: Secondary | ICD-10-CM | POA: Diagnosis not present

## 2018-05-24 DIAGNOSIS — O4403 Placenta previa specified as without hemorrhage, third trimester: Secondary | ICD-10-CM | POA: Diagnosis not present

## 2018-05-24 DIAGNOSIS — O26893 Other specified pregnancy related conditions, third trimester: Secondary | ICD-10-CM | POA: Diagnosis not present

## 2018-06-04 DIAGNOSIS — O4443 Low lying placenta NOS or without hemorrhage, third trimester: Secondary | ICD-10-CM | POA: Diagnosis not present

## 2018-06-04 DIAGNOSIS — Z3A31 31 weeks gestation of pregnancy: Secondary | ICD-10-CM | POA: Diagnosis not present

## 2018-06-18 DIAGNOSIS — O4443 Low lying placenta NOS or without hemorrhage, third trimester: Secondary | ICD-10-CM | POA: Diagnosis not present

## 2018-06-18 DIAGNOSIS — Z3A33 33 weeks gestation of pregnancy: Secondary | ICD-10-CM | POA: Diagnosis not present

## 2018-07-05 DIAGNOSIS — Z3A36 36 weeks gestation of pregnancy: Secondary | ICD-10-CM | POA: Diagnosis not present

## 2018-07-05 DIAGNOSIS — O09523 Supervision of elderly multigravida, third trimester: Secondary | ICD-10-CM | POA: Diagnosis not present

## 2018-07-05 DIAGNOSIS — Z3685 Encounter for antenatal screening for Streptococcus B: Secondary | ICD-10-CM | POA: Diagnosis not present

## 2018-07-11 DIAGNOSIS — O09523 Supervision of elderly multigravida, third trimester: Secondary | ICD-10-CM | POA: Diagnosis not present

## 2018-07-11 DIAGNOSIS — Z3A36 36 weeks gestation of pregnancy: Secondary | ICD-10-CM | POA: Diagnosis not present

## 2018-07-11 DIAGNOSIS — O4443 Low lying placenta NOS or without hemorrhage, third trimester: Secondary | ICD-10-CM | POA: Diagnosis not present

## 2018-07-11 LAB — OB RESULTS CONSOLE GBS: GBS: POSITIVE

## 2018-07-12 DIAGNOSIS — Z3483 Encounter for supervision of other normal pregnancy, third trimester: Secondary | ICD-10-CM | POA: Diagnosis not present

## 2018-07-15 ENCOUNTER — Other Ambulatory Visit: Payer: Self-pay | Admitting: Obstetrics and Gynecology

## 2018-07-17 ENCOUNTER — Encounter (HOSPITAL_COMMUNITY): Payer: Self-pay | Admitting: *Deleted

## 2018-07-17 DIAGNOSIS — Z3A37 37 weeks gestation of pregnancy: Secondary | ICD-10-CM | POA: Diagnosis not present

## 2018-07-17 DIAGNOSIS — O34219 Maternal care for unspecified type scar from previous cesarean delivery: Secondary | ICD-10-CM | POA: Diagnosis not present

## 2018-07-17 DIAGNOSIS — O09523 Supervision of elderly multigravida, third trimester: Secondary | ICD-10-CM | POA: Diagnosis not present

## 2018-07-18 ENCOUNTER — Encounter (HOSPITAL_COMMUNITY): Payer: Self-pay

## 2018-07-18 NOTE — Patient Instructions (Signed)
Hermione Hausknecht  07/18/2018   Your procedure is scheduled on:  08/01/2018  Arrive at 0500 at [facility] on [address] at [facility]  and [address]. You are invited to use the FREE valet parking or use the Visitor's parking deck.  Pick up the phone at the desk and dial [phone].  Call this number if you have problems the morning of surgery: [phone]  Remember:   Do not eat food:(After Midnight) Desps de medianoche.  Do not drink clear liquids: (After Midnight) Desps de medianoche.  Take these medicines the morning of surgery with A SIP OF WATER:  none   Do not wear jewelry, make-up or nail polish.  Do not wear lotions, powders, or perfumes. Do not wear deodorant.  Do not shave 48 hours prior to surgery.  Do not bring valuables to the hospital.  [facility] is not   responsible for any belongings or valuables brought to the hospital.  Contacts, dentures or bridgework may not be worn into surgery.  Leave suitcase in the car. After surgery it may be brought to your room.  For patients admitted to the hospital, checkout time is 11:00 AM the day of              discharge.      Please read over the following fact sheets that you were given:     Preparing for Surgery

## 2018-07-23 ENCOUNTER — Telehealth (HOSPITAL_COMMUNITY): Payer: Self-pay | Admitting: *Deleted

## 2018-07-23 NOTE — Telephone Encounter (Signed)
covid appt

## 2018-07-24 DIAGNOSIS — Z3A38 38 weeks gestation of pregnancy: Secondary | ICD-10-CM | POA: Diagnosis not present

## 2018-07-24 DIAGNOSIS — O09523 Supervision of elderly multigravida, third trimester: Secondary | ICD-10-CM | POA: Diagnosis not present

## 2018-07-25 ENCOUNTER — Other Ambulatory Visit: Payer: Self-pay | Admitting: Obstetrics and Gynecology

## 2018-07-29 DIAGNOSIS — Z3A39 39 weeks gestation of pregnancy: Secondary | ICD-10-CM | POA: Diagnosis not present

## 2018-07-29 DIAGNOSIS — O09523 Supervision of elderly multigravida, third trimester: Secondary | ICD-10-CM | POA: Diagnosis not present

## 2018-07-30 ENCOUNTER — Other Ambulatory Visit: Payer: Self-pay

## 2018-07-30 ENCOUNTER — Other Ambulatory Visit (HOSPITAL_COMMUNITY)
Admission: RE | Admit: 2018-07-30 | Discharge: 2018-07-30 | Disposition: A | Discharge status: Home / Self Care | Payer: BLUE CROSS/BLUE SHIELD | Source: Ambulatory Visit | Attending: Obstetrics and Gynecology | Admitting: Obstetrics and Gynecology

## 2018-07-30 DIAGNOSIS — Z1159 Encounter for screening for other viral diseases: Secondary | ICD-10-CM | POA: Diagnosis not present

## 2018-07-30 NOTE — MAU Note (Signed)
Asymptomatic. No problems collecting swab

## 2018-07-31 ENCOUNTER — Encounter (HOSPITAL_COMMUNITY)
Admission: RE | Admit: 2018-07-31 | Discharge: 2018-07-31 | Disposition: A | Discharge status: Home / Self Care | Payer: BLUE CROSS/BLUE SHIELD | Source: Ambulatory Visit

## 2018-07-31 LAB — NOVEL CORONAVIRUS, NAA (HOSP ORDER, SEND-OUT TO REF LAB; TAT 18-24 HRS): SARS-CoV-2, NAA: NOT DETECTED

## 2018-07-31 NOTE — Anesthesia Preprocedure Evaluation (Addendum)
Anesthesia Evaluation  Patient identified by MRN, date of birth, ID band Patient awake    Reviewed: Allergy & Precautions, NPO status , Patient's Chart, lab work & pertinent test results  Airway Mallampati: II  TM Distance: >3 FB Neck ROM: Full    Dental no notable dental hx. (+) Teeth Intact   Pulmonary neg pulmonary ROS,    Pulmonary exam normal breath sounds clear to auscultation       Cardiovascular Exercise Tolerance: Good negative cardio ROS Normal cardiovascular exam Rhythm:Regular Rate:Normal     Neuro/Psych negative neurological ROS  negative psych ROS   GI/Hepatic Neg liver ROS,   Endo/Other  negative endocrine ROS  Renal/GU negative Renal ROS     Musculoskeletal   Abdominal   Peds  Hematology negative hematology ROS (+)   Anesthesia Other Findings   Reproductive/Obstetrics (+) Pregnancy                            Anesthesia Physical Anesthesia Plan  ASA: II  Anesthesia Plan: Spinal   Post-op Pain Management:    Induction:   PONV Risk Score and Plan: Treatment may vary due to age or medical condition, Dexamethasone and Ondansetron  Airway Management Planned: Natural Airway and Simple Face Mask  Additional Equipment:   Intra-op Plan:   Post-operative Plan:   Informed Consent: I have reviewed the patients History and Physical, chart, labs and discussed the procedure including the risks, benefits and alternatives for the proposed anesthesia with the patient or authorized representative who has indicated his/her understanding and acceptance.     Dental advisory given  Plan Discussed with:   Anesthesia Plan Comments: (RPT CS under Spinal)       Anesthesia Quick Evaluation

## 2018-08-01 ENCOUNTER — Encounter (HOSPITAL_COMMUNITY)
Admission: RE | Disposition: A | Discharge status: Home / Self Care | Payer: Self-pay | Source: Home / Self Care | Attending: Obstetrics and Gynecology

## 2018-08-01 ENCOUNTER — Inpatient Hospital Stay (HOSPITAL_COMMUNITY): Payer: BLUE CROSS/BLUE SHIELD | Admitting: Anesthesiology

## 2018-08-01 ENCOUNTER — Encounter (HOSPITAL_COMMUNITY): Payer: Self-pay | Admitting: *Deleted

## 2018-08-01 ENCOUNTER — Other Ambulatory Visit: Payer: Self-pay

## 2018-08-01 ENCOUNTER — Inpatient Hospital Stay (HOSPITAL_COMMUNITY)
Admission: RE | Admit: 2018-08-01 | Discharge: 2018-08-03 | DRG: 787 | Disposition: A | Discharge status: Home / Self Care | Payer: BLUE CROSS/BLUE SHIELD | Attending: Obstetrics and Gynecology | Admitting: Obstetrics and Gynecology

## 2018-08-01 DIAGNOSIS — D62 Acute posthemorrhagic anemia: Secondary | ICD-10-CM | POA: Diagnosis not present

## 2018-08-01 DIAGNOSIS — O99824 Streptococcus B carrier state complicating childbirth: Secondary | ICD-10-CM | POA: Diagnosis present

## 2018-08-01 DIAGNOSIS — O9972 Diseases of the skin and subcutaneous tissue complicating childbirth: Secondary | ICD-10-CM | POA: Diagnosis not present

## 2018-08-01 DIAGNOSIS — Z3A39 39 weeks gestation of pregnancy: Secondary | ICD-10-CM

## 2018-08-01 DIAGNOSIS — Z3A00 Weeks of gestation of pregnancy not specified: Secondary | ICD-10-CM | POA: Diagnosis not present

## 2018-08-01 DIAGNOSIS — O9081 Anemia of the puerperium: Secondary | ICD-10-CM | POA: Diagnosis not present

## 2018-08-01 DIAGNOSIS — L910 Hypertrophic scar: Secondary | ICD-10-CM | POA: Diagnosis present

## 2018-08-01 DIAGNOSIS — O34211 Maternal care for low transverse scar from previous cesarean delivery: Secondary | ICD-10-CM | POA: Diagnosis not present

## 2018-08-01 DIAGNOSIS — K219 Gastro-esophageal reflux disease without esophagitis: Secondary | ICD-10-CM | POA: Diagnosis not present

## 2018-08-01 DIAGNOSIS — Z23 Encounter for immunization: Secondary | ICD-10-CM | POA: Diagnosis not present

## 2018-08-01 DIAGNOSIS — O9962 Diseases of the digestive system complicating childbirth: Secondary | ICD-10-CM | POA: Diagnosis not present

## 2018-08-01 DIAGNOSIS — Z3A40 40 weeks gestation of pregnancy: Secondary | ICD-10-CM | POA: Diagnosis not present

## 2018-08-01 DIAGNOSIS — O34219 Maternal care for unspecified type scar from previous cesarean delivery: Secondary | ICD-10-CM | POA: Diagnosis not present

## 2018-08-01 DIAGNOSIS — Z98891 History of uterine scar from previous surgery: Secondary | ICD-10-CM

## 2018-08-01 LAB — CBC
HCT: 41.5 % (ref 36.0–46.0)
Hemoglobin: 13.8 g/dL (ref 12.0–15.0)
MCH: 31 pg (ref 26.0–34.0)
MCHC: 33.3 g/dL (ref 30.0–36.0)
MCV: 93.3 fL (ref 80.0–100.0)
Platelets: 242 10*3/uL (ref 150–400)
RBC: 4.45 MIL/uL (ref 3.87–5.11)
RDW: 12.7 % (ref 11.5–15.5)
WBC: 9.8 10*3/uL (ref 4.0–10.5)
nRBC: 0 % (ref 0.0–0.2)

## 2018-08-01 LAB — TYPE AND SCREEN
ABO/RH(D): B POS
Antibody Screen: NEGATIVE

## 2018-08-01 LAB — POCT I-STAT EG7
Acid-base deficit: 1 mmol/L (ref 0.0–2.0)
Bicarbonate: 22.4 mmol/L (ref 20.0–28.0)
Calcium, Ion: 1.23 mmol/L (ref 1.15–1.40)
HCT: 35 % — ABNORMAL LOW (ref 36.0–46.0)
Hemoglobin: 11.9 g/dL — ABNORMAL LOW (ref 12.0–15.0)
O2 Saturation: 94 %
Potassium: 3.7 mmol/L (ref 3.5–5.1)
Sodium: 136 mmol/L (ref 135–145)
TCO2: 23 mmol/L (ref 22–32)
pCO2, Ven: 34 mmHg — ABNORMAL LOW (ref 44.0–60.0)
pH, Ven: 7.426 (ref 7.250–7.430)
pO2, Ven: 68 mmHg — ABNORMAL HIGH (ref 32.0–45.0)

## 2018-08-01 LAB — ABO/RH: ABO/RH(D): B POS

## 2018-08-01 LAB — RPR: RPR Ser Ql: NONREACTIVE

## 2018-08-01 SURGERY — Surgical Case
Anesthesia: Spinal | Wound class: Clean Contaminated

## 2018-08-01 MED ORDER — ONDANSETRON HCL 4 MG/2ML IJ SOLN: 4.0000 mg | Freq: Once | INTRAMUSCULAR | Status: DC | PRN

## 2018-08-01 MED ORDER — NALBUPHINE HCL 10 MG/ML IJ SOLN: 5.0000 mg | Freq: Once | INTRAMUSCULAR | Status: DC | PRN

## 2018-08-01 MED ORDER — DIPHENHYDRAMINE HCL 50 MG/ML IJ SOLN: 12.5000 mg | INTRAMUSCULAR | Status: DC | PRN

## 2018-08-01 MED ORDER — FENTANYL CITRATE (PF) 100 MCG/2ML IJ SOLN
INTRAMUSCULAR | Status: AC
  Filled 2018-08-01: qty 2

## 2018-08-01 MED ORDER — MORPHINE SULFATE (PF) 0.5 MG/ML IJ SOLN
INTRAMUSCULAR | Status: DC | PRN
  Administered 2018-08-01: .15 mg via INTRATHECAL

## 2018-08-01 MED ORDER — OXYTOCIN 40 UNITS IN NORMAL SALINE INFUSION - SIMPLE MED: 2.5000 [IU]/h | INTRAVENOUS | Status: DC

## 2018-08-01 MED ORDER — DEXAMETHASONE SODIUM PHOSPHATE 4 MG/ML IJ SOLN
INTRAMUSCULAR | Status: DC | PRN
  Administered 2018-08-01: 4 mg via INTRAVENOUS

## 2018-08-01 MED ORDER — KETOROLAC TROMETHAMINE 30 MG/ML IJ SOLN
INTRAMUSCULAR | Status: AC
  Filled 2018-08-01: qty 1

## 2018-08-01 MED ORDER — SIMETHICONE 80 MG PO CHEW
80.0000 mg | CHEWABLE_TABLET | Freq: Three times a day (TID) | ORAL | Status: DC
  Administered 2018-08-01 – 2018-08-03 (×5): 80 mg via ORAL
  Filled 2018-08-01 (×6): qty 1

## 2018-08-01 MED ORDER — ACETAMINOPHEN 160 MG/5ML PO SOLN: 325.0000 mg | ORAL | Status: DC | PRN

## 2018-08-01 MED ORDER — SODIUM CHLORIDE 0.9% FLUSH: 3.0000 mL | INTRAVENOUS | Status: DC | PRN

## 2018-08-01 MED ORDER — HYDROMORPHONE HCL 1 MG/ML IJ SOLN: 0.2500 mg | INTRAMUSCULAR | Status: DC | PRN

## 2018-08-01 MED ORDER — KETOROLAC TROMETHAMINE 30 MG/ML IJ SOLN
30.0000 mg | Freq: Four times a day (QID) | INTRAMUSCULAR | Status: DC | PRN
  Administered 2018-08-01 – 2018-08-02 (×3): 30 mg via INTRAVENOUS
  Filled 2018-08-01 (×3): qty 1

## 2018-08-01 MED ORDER — KETOROLAC TROMETHAMINE 30 MG/ML IJ SOLN
30.0000 mg | Freq: Four times a day (QID) | INTRAMUSCULAR | Status: DC | PRN
  Administered 2018-08-01: 30 mg via INTRAMUSCULAR

## 2018-08-01 MED ORDER — DEXAMETHASONE SODIUM PHOSPHATE 4 MG/ML IJ SOLN
INTRAMUSCULAR | Status: AC
  Filled 2018-08-01: qty 1

## 2018-08-01 MED ORDER — LACTATED RINGERS IV SOLN
INTRAVENOUS | Status: DC
  Administered 2018-08-01: 07:00:00 via INTRAVENOUS

## 2018-08-01 MED ORDER — ONDANSETRON HCL 4 MG/2ML IJ SOLN
INTRAMUSCULAR | Status: AC
  Filled 2018-08-01: qty 2

## 2018-08-01 MED ORDER — SIMETHICONE 80 MG PO CHEW
80.0000 mg | CHEWABLE_TABLET | ORAL | Status: DC
  Administered 2018-08-01 – 2018-08-02 (×2): 80 mg via ORAL
  Filled 2018-08-01 (×2): qty 1

## 2018-08-01 MED ORDER — COCONUT OIL OIL: 1.0000 "application " | TOPICAL_OIL | Status: DC | PRN

## 2018-08-01 MED ORDER — OXYTOCIN 40 UNITS IN NORMAL SALINE INFUSION - SIMPLE MED
INTRAVENOUS | Status: DC | PRN
  Administered 2018-08-01: 75 mL via INTRAVENOUS
  Administered 2018-08-01 (×7): 60 mL via INTRAVENOUS

## 2018-08-01 MED ORDER — OXYTOCIN 40 UNITS IN NORMAL SALINE INFUSION - SIMPLE MED
INTRAVENOUS | Status: AC
  Filled 2018-08-01: qty 1000

## 2018-08-01 MED ORDER — CEFAZOLIN SODIUM-DEXTROSE 2-4 GM/100ML-% IV SOLN
INTRAVENOUS | Status: AC
  Filled 2018-08-01: qty 100

## 2018-08-01 MED ORDER — TRIAMCINOLONE ACETONIDE 40 MG/ML IJ SUSP
INTRAMUSCULAR | Status: DC | PRN
  Administered 2018-08-01: 40 mg via INTRAMUSCULAR

## 2018-08-01 MED ORDER — MEPERIDINE HCL 25 MG/ML IJ SOLN: 6.2500 mg | INTRAMUSCULAR | Status: DC | PRN

## 2018-08-01 MED ORDER — DIBUCAINE (PERIANAL) 1 % EX OINT: 1.0000 "application " | TOPICAL_OINTMENT | CUTANEOUS | Status: DC | PRN

## 2018-08-01 MED ORDER — OXYCODONE HCL 5 MG/5ML PO SOLN: 5.0000 mg | Freq: Once | ORAL | Status: DC | PRN

## 2018-08-01 MED ORDER — PHENYLEPHRINE HCL-NACL 20-0.9 MG/250ML-% IV SOLN
INTRAVENOUS | Status: AC
  Filled 2018-08-01: qty 250

## 2018-08-01 MED ORDER — SODIUM CHLORIDE 0.9 % IR SOLN
Status: DC | PRN
  Administered 2018-08-01: 1

## 2018-08-01 MED ORDER — PHENYLEPHRINE HCL-NACL 20-0.9 MG/250ML-% IV SOLN
INTRAVENOUS | Status: DC | PRN
  Administered 2018-08-01: 60 ug/min via INTRAVENOUS

## 2018-08-01 MED ORDER — LACTATED RINGERS IV SOLN
INTRAVENOUS | Status: DC
  Administered 2018-08-01 – 2018-08-02 (×2): via INTRAVENOUS

## 2018-08-01 MED ORDER — ONDANSETRON HCL 4 MG/2ML IJ SOLN
INTRAMUSCULAR | Status: DC | PRN
  Administered 2018-08-01: 4 mg via INTRAVENOUS

## 2018-08-01 MED ORDER — MORPHINE SULFATE (PF) 0.5 MG/ML IJ SOLN
INTRAMUSCULAR | Status: AC
  Filled 2018-08-01: qty 10

## 2018-08-01 MED ORDER — OXYCODONE HCL 5 MG PO TABS: 5.0000 mg | ORAL_TABLET | ORAL | Status: DC | PRN

## 2018-08-01 MED ORDER — ONDANSETRON HCL 4 MG/2ML IJ SOLN: 4.0000 mg | Freq: Three times a day (TID) | INTRAMUSCULAR | Status: DC | PRN

## 2018-08-01 MED ORDER — FENTANYL CITRATE (PF) 100 MCG/2ML IJ SOLN
INTRAMUSCULAR | Status: DC | PRN
  Administered 2018-08-01: 15 ug via INTRATHECAL

## 2018-08-01 MED ORDER — SIMETHICONE 80 MG PO CHEW: 80.0000 mg | CHEWABLE_TABLET | ORAL | Status: DC | PRN

## 2018-08-01 MED ORDER — DIPHENHYDRAMINE HCL 25 MG PO CAPS: 25.0000 mg | ORAL_CAPSULE | Freq: Four times a day (QID) | ORAL | Status: DC | PRN

## 2018-08-01 MED ORDER — OXYCODONE HCL 5 MG PO TABS: 5.0000 mg | ORAL_TABLET | Freq: Once | ORAL | Status: DC | PRN

## 2018-08-01 MED ORDER — NALBUPHINE HCL 10 MG/ML IJ SOLN: 5.0000 mg | INTRAMUSCULAR | Status: DC | PRN

## 2018-08-01 MED ORDER — CEFAZOLIN SODIUM-DEXTROSE 2-3 GM-%(50ML) IV SOLR
INTRAVENOUS | Status: DC | PRN
  Administered 2018-08-01: 2 g via INTRAVENOUS

## 2018-08-01 MED ORDER — TRIAMCINOLONE ACETONIDE 40 MG/ML IJ SUSP
INTRAMUSCULAR | Status: AC
  Filled 2018-08-01: qty 1

## 2018-08-01 MED ORDER — MENTHOL 3 MG MT LOZG: 1.0000 | LOZENGE | OROMUCOSAL | Status: DC | PRN

## 2018-08-01 MED ORDER — BUPIVACAINE IN DEXTROSE 0.75-8.25 % IT SOLN
INTRATHECAL | Status: DC | PRN
  Administered 2018-08-01: 1.5 mL via INTRATHECAL

## 2018-08-01 MED ORDER — BUPIVACAINE HCL (PF) 0.25 % IJ SOLN
INTRAMUSCULAR | Status: DC | PRN
  Administered 2018-08-01: 10 mL

## 2018-08-01 MED ORDER — ACETAMINOPHEN 325 MG PO TABS: 325.0000 mg | ORAL_TABLET | ORAL | Status: DC | PRN

## 2018-08-01 MED ORDER — CEFAZOLIN SODIUM-DEXTROSE 2-4 GM/100ML-% IV SOLN: 2.0000 g | INTRAVENOUS | Status: DC

## 2018-08-01 MED ORDER — WITCH HAZEL-GLYCERIN EX PADS: 1.0000 "application " | MEDICATED_PAD | CUTANEOUS | Status: DC | PRN

## 2018-08-01 MED ORDER — ACETAMINOPHEN 500 MG PO TABS
1000.0000 mg | ORAL_TABLET | Freq: Four times a day (QID) | ORAL | Status: DC
  Administered 2018-08-01 – 2018-08-03 (×9): 1000 mg via ORAL
  Filled 2018-08-01 (×9): qty 2

## 2018-08-01 MED ORDER — NALOXONE HCL 0.4 MG/ML IJ SOLN: 0.4000 mg | INTRAMUSCULAR | Status: DC | PRN

## 2018-08-01 MED ORDER — DIPHENHYDRAMINE HCL 25 MG PO CAPS: 25.0000 mg | ORAL_CAPSULE | ORAL | Status: DC | PRN

## 2018-08-01 MED ORDER — BUPIVACAINE HCL (PF) 0.25 % IJ SOLN
INTRAMUSCULAR | Status: AC
  Filled 2018-08-01: qty 20

## 2018-08-01 MED ORDER — ZOLPIDEM TARTRATE 5 MG PO TABS: 5.0000 mg | ORAL_TABLET | Freq: Every evening | ORAL | Status: DC | PRN

## 2018-08-01 MED ORDER — TRIAMCINOLONE ACETONIDE 40 MG/ML IJ SUSP: 40.0000 mg | Freq: Once | INTRAMUSCULAR | Status: DC

## 2018-08-01 MED ORDER — NALOXONE HCL 4 MG/10ML IJ SOLN
1.0000 ug/kg/h | INTRAVENOUS | Status: DC | PRN
  Filled 2018-08-01: qty 5

## 2018-08-01 MED ORDER — SCOPOLAMINE 1 MG/3DAYS TD PT72: 1.0000 | MEDICATED_PATCH | Freq: Once | TRANSDERMAL | Status: DC

## 2018-08-01 MED ORDER — PRENATAL MULTIVITAMIN CH
1.0000 | ORAL_TABLET | Freq: Every day | ORAL | Status: DC
  Administered 2018-08-02 – 2018-08-03 (×2): 1 via ORAL
  Filled 2018-08-01 (×2): qty 1

## 2018-08-01 MED ORDER — KETOROLAC TROMETHAMINE 30 MG/ML IJ SOLN: 30.0000 mg | Freq: Once | INTRAMUSCULAR | Status: DC | PRN

## 2018-08-01 MED ORDER — SENNOSIDES-DOCUSATE SODIUM 8.6-50 MG PO TABS
2.0000 | ORAL_TABLET | ORAL | Status: DC
  Administered 2018-08-01 – 2018-08-02 (×2): 2 via ORAL
  Filled 2018-08-01 (×2): qty 2

## 2018-08-01 SURGICAL SUPPLY — 45 items
BARRIER ADHS 3X4 INTERCEED (GAUZE/BANDAGES/DRESSINGS) ×2 IMPLANT
BENZOIN TINCTURE PRP APPL 2/3 (GAUZE/BANDAGES/DRESSINGS) ×2 IMPLANT
CHLORAPREP W/TINT 26ML (MISCELLANEOUS) ×2 IMPLANT
CLAMP CORD UMBIL (MISCELLANEOUS) IMPLANT
CLOSURE STERI STRIP 1/2 X4 (GAUZE/BANDAGES/DRESSINGS) ×2 IMPLANT
CLOTH BEACON ORANGE TIMEOUT ST (SAFETY) ×2 IMPLANT
DRAPE C SECTION CLR SCREEN (DRAPES) ×2 IMPLANT
DRSG OPSITE POSTOP 4X10 (GAUZE/BANDAGES/DRESSINGS) ×2 IMPLANT
ELECT REM PT RETURN 9FT ADLT (ELECTROSURGICAL) ×2
ELECTRODE REM PT RTRN 9FT ADLT (ELECTROSURGICAL) ×1 IMPLANT
EXTRACTOR VACUUM M CUP 4 TUBE (SUCTIONS) IMPLANT
GAUZE SPONGE 4X4 12PLY STRL LF (GAUZE/BANDAGES/DRESSINGS) ×4 IMPLANT
GLOVE BIOGEL PI IND STRL 7.0 (GLOVE) ×2 IMPLANT
GLOVE BIOGEL PI INDICATOR 7.0 (GLOVE) ×2
GLOVE ECLIPSE 6.5 STRL STRAW (GLOVE) ×2 IMPLANT
GOWN STRL REUS W/TWL LRG LVL3 (GOWN DISPOSABLE) ×4 IMPLANT
KIT ABG SYR 3ML LUER SLIP (SYRINGE) IMPLANT
NEEDLE HYPO 22GX1.5 SAFETY (NEEDLE) ×2 IMPLANT
NEEDLE HYPO 25X5/8 SAFETYGLIDE (NEEDLE) IMPLANT
NS IRRIG 1000ML POUR BTL (IV SOLUTION) ×2 IMPLANT
PACK C SECTION WH (CUSTOM PROCEDURE TRAY) ×2 IMPLANT
PAD ABD 7.5X8 STRL (GAUZE/BANDAGES/DRESSINGS) ×2 IMPLANT
PAD OB MATERNITY 4.3X12.25 (PERSONAL CARE ITEMS) ×2 IMPLANT
RTRCTR C-SECT PINK 25CM LRG (MISCELLANEOUS) IMPLANT
STRIP CLOSURE SKIN 1/2X4 (GAUZE/BANDAGES/DRESSINGS) IMPLANT
SUT CHROMIC GUT AB #0 18 (SUTURE) IMPLANT
SUT MNCRL 0 VIOLET CTX 36 (SUTURE) ×3 IMPLANT
SUT MON AB 2-0 SH 27 (SUTURE)
SUT MON AB 2-0 SH27 (SUTURE) IMPLANT
SUT MON AB 3-0 SH 27 (SUTURE)
SUT MON AB 3-0 SH27 (SUTURE) IMPLANT
SUT MON AB 4-0 PS1 27 (SUTURE) IMPLANT
SUT MONOCRYL 0 CTX 36 (SUTURE) ×3
SUT PDS AB 0 CTX 36 PDP370T (SUTURE) ×4 IMPLANT
SUT PLAIN 2 0 (SUTURE)
SUT PLAIN 2 0 XLH (SUTURE) IMPLANT
SUT PLAIN ABS 2-0 CT1 27XMFL (SUTURE) IMPLANT
SUT VIC AB 0 CT1 36 (SUTURE) ×4 IMPLANT
SUT VIC AB 2-0 CT1 27 (SUTURE) ×1
SUT VIC AB 2-0 CT1 TAPERPNT 27 (SUTURE) ×1 IMPLANT
SUT VIC AB 4-0 PS2 27 (SUTURE) IMPLANT
SYR CONTROL 10ML LL (SYRINGE) ×2 IMPLANT
TOWEL OR 17X24 6PK STRL BLUE (TOWEL DISPOSABLE) ×2 IMPLANT
TRAY FOLEY W/BAG SLVR 14FR LF (SET/KITS/TRAYS/PACK) IMPLANT
WATER STERILE IRR 1000ML POUR (IV SOLUTION) ×2 IMPLANT

## 2018-08-01 NOTE — H&P (Addendum)
Vanessa Vanessa [name] is Vanessa [name] 43 y.o. female presenting for repeat C/S at term gestation.. OB History    Gravida  2   Para  1   Term  1   Preterm      AB      Living  1     SAB      TAB      Ectopic      Multiple  0   Live Births  1          Past Medical History:  Diagnosis Date  . GERD (gastroesophageal reflux disease)   . Postpartum care following cesarean delivery Indication: NRFHR (2/7) 04/26/2016   Past Surgical History:  Procedure Laterality Date  . CESAREAN SECTION N/Vanessa [name] 04/26/2016   Procedure: CESAREAN SECTION;  Surgeon: Genia Del, MD;  Location: [facility] BIRTHING SUITES;  Service: Obstetrics;  Laterality: N/Vanessa [name];   Family History: family history includes Cancer in her maternal aunt and mother; Diabetes in her father and mother; Kidney disease in her mother. Social History:  reports that she has never smoked. She has never used smokeless tobacco. She reports that she does not drink alcohol or use drugs.     Maternal Diabetes: No Genetic Screening: Normal Maternal Ultrasounds/Referrals: Normal Fetal Ultrasounds or other Referrals:  None   Maternal Substance Abuse:  No Significant Maternal Medications:  None Significant Maternal Lab Results:  Lab values include: Group B Strep positive Other Comments:  previa resolved. AMA  Review of Systems  All other systems reviewed and are negative.  History   Blood pressure 125/75, temperature 98 F (36.7 C), temperature source Oral, height 5' (1.524 m), weight 73.5 kg, last menstrual period 10/26/2017, SpO2 100 %, unknown if currently breastfeeding. Exam Physical Exam  Constitutional: She is oriented to person, place, and time. She appears well-developed and well-nourished.  HENT:  Head: Atraumatic.  Eyes: EOM are normal.  Neck: Neck supple.  Cardiovascular: Regular rhythm.  Respiratory: Effort normal.  GI: Soft.  Pfannenstiel scar  Genitourinary:    Genitourinary Comments: Cervix: l/c/oop   Musculoskeletal:      General: No edema.  Neurological: She is alert and oriented to person, place, and time.  Skin: Skin is warm and dry.  Psychiatric: She has Vanessa [name] normal mood and affect.    Prenatal labs: ABO, Rh: --/--/PENDING (05/14 0545) Antibody: PENDING (05/14 0545) Rubella: Immune (10/14 0000) RPR: Nonreactive (10/14 0000)  HBsAg: Negative (10/14 0000)  HIV: Non-reactive (10/14 0000)  GBS: Positive (04/23 0000)   Assessment/Plan: Previous C/s Term gestation AMA P) repeat C/s. Risk of surgery includes infection, bleeding, possible need for blood transfusion and its risk, internal scar tissue, internal scar tissue, injury to surrounding organ structures. All ? answered  Vanessa Vanessa [name] Vanessa Vanessa [name] 08/01/2018, 6:37 AM   Addendum: up date: notes some contraction and light vaginal bleeding noted yesterday. In no acute distress ve deferred I have reexamined pt. No change since last visit

## 2018-08-01 NOTE — Op Note (Signed)
Vanessa [name], Vanessa [name] MEDICAL RECORD PP:[id number] ACCOUNT [id number] DATE OF BIRTH:1975-11-20 FACILITY: [facility] LOCATION: [facility] PHYSICIAN:Aryanna Shaver A. Dewon Mendizabal, MD  OPERATIVE REPORT  DATE OF PROCEDURE:  08/01/2018  PREOPERATIVE DIAGNOSIS:  Previous cesarean section, term gestation, advanced maternal age.  PROCEDURE:  Repeat cesarean section, Kerr hysterotomy.  POSTOPERATIVE DIAGNOSIS:  Previous cesarean section, term gestation, advanced maternal age.  ANESTHESIA:  Spinal.  SURGEON:  Maxie Better, MD  ASSISTANTS:  Arlan Organ, CNM.  DESCRIPTION OF PROCEDURE:  Under adequate spinal anesthesia, the patient was placed in the supine position with a left lateral tilt.  She was sterilely prepped and draped in the usual fashion.  Indwelling Foley catheter was sterilely placed.  Marcaine  0.25% with Kenalog was introduced into the previous Pfannenstiel skin incision, which was a keloid scar.  An incision was made below the keloid carried down to the rectus fascia.  Rectus fascia opened transversely.  Rectus fascia was then bluntly and  sharply dissected off the rectus muscle in a superior and inferior fashion.  The rectus muscle split and was already separated.  The parietal peritoneum was entered bluntly and the remaining incision was extended superiorly and inferiorly.  A  self-retaining Alexis retractor was then placed.  A thin lower uterine segment was noted.  Bladder adhesions were noted.  Careful opening of the vesicouterine peritoneum transversely was done and the bladder was gently displaced inferiorly.  A  curvilinear low transverse incision was then made and extended bluntly in a cephalic and caudad fashion.  Copious clear amniotic fluid was noted.  Subsequent delivery of a live female from the right occiput transverse position was accomplished.  Baby was  bulb suctioned on the abdomen.  The cord was delayed clamped x1 minute, the cord was clamped and cut.  The baby was  transferred to the awaiting pediatricians who assigned Apgars of 9 and 9 at 1 and 5 minutes respectively.  Placenta was manually removed.   Uterine cavity was cleaned of debris.  Uterine incision had no extension.  It was closed in 2 layers, the first layer 0 Monocryl running lock stitch, second layer was imbricating using 0 Monocryl sutures.  Normal tubes and ovaries were noted laterally.   The abdomen was irrigated and suctioned of debris.  Interceed was placed in inverted T fashion overlying the uterine incision.  The Alexis retractor was then removed.  The parietal peritoneum was closed with 2-0 Vicryl.  The rectus fascia was closed  with 0 PDS x2.  The subcutaneous area was irrigated, small bleeders cauterized.  The keloid scar was removed and the incision closed with 4-0 Vicryl subcuticular closure.  Steri-Strips and benzoin was placed.  SPECIMEN:  Placenta not sent to pathology.  ESTIMATED BLOOD LOSS:  537 mL  INTRAOPERATIVE FLUIDS:  1500 mL  URINE OUTPUT:  150 mL clear yellow urine.  COUNTS:  Sponge and instrument counts x2 was correct.  COMPLICATIONS:  None.  DISPOSITION:  The patient tolerated the procedure well and was transferred to recovery room in stable condition.  TN/NUANCE  D:08/01/2018 T:08/01/2018 JOB:[id number]

## 2018-08-01 NOTE — Transfer of Care (Signed)
Immediate Anesthesia Transfer of Care Note  Patient: Vanessa [name]  Procedure(s) Performed: REPEAT CESAREAN SECTION (N/A )  Patient Location: PACU  Anesthesia Type:Spinal  Level of Consciousness: awake, alert  and oriented  Airway & Oxygen Therapy: Patient Spontanous Breathing  Post-op Assessment: Report given to RN and Post -op Vital signs reviewed and stable  Post vital signs: Reviewed and stable  Last Vitals:  Vitals Value Taken Time  BP 115/69 08/01/2018  9:06 AM  Temp 36.6 C 08/01/2018  9:06 AM  Pulse 81 08/01/2018  9:15 AM  Resp 15 08/01/2018  9:15 AM  SpO2 99 % 08/01/2018  9:15 AM  Vitals shown include unvalidated device data.  Last Pain:  Vitals:   08/01/18 0906  TempSrc: Oral  PainSc: 0-No pain         Complications: No apparent anesthesia complications

## 2018-08-01 NOTE — Anesthesia Procedure Notes (Signed)
Spinal  Patient location during procedure: OB Start time: 08/01/2018 7:43 AM End time: 08/01/2018 7:49 AM Staffing Anesthesiologist: Trevor Iha, MD Performed: anesthesiologist  Preanesthetic Checklist Completed: patient identified, surgical consent, pre-op evaluation, timeout performed, IV checked, risks and benefits discussed and monitors and equipment checked Spinal Block Patient position: sitting Prep: site prepped and draped and DuraPrep Patient monitoring: heart rate, cardiac monitor, continuous pulse ox and blood pressure Approach: midline Location: L3-4 Injection technique: single-shot Needle Needle type: Pencan  Needle gauge: 24 G Needle length: 10 cm Needle insertion depth: 4 cm Assessment Sensory level: T4 Additional Notes 1 Attempt (s). Pt tolerated procedure well.

## 2018-08-01 NOTE — Anesthesia Postprocedure Evaluation (Signed)
Anesthesia Post Note  Patient: Vanessa [name]  Procedure(s) Performed: REPEAT CESAREAN SECTION (N/A )     Patient location during evaluation: Mother Baby Anesthesia Type: Spinal Level of consciousness: oriented and awake and alert Pain management: pain level controlled Vital Signs Assessment: post-procedure vital signs reviewed and stable Respiratory status: spontaneous breathing and respiratory function stable Cardiovascular status: blood pressure returned to baseline and stable Postop Assessment: no headache, no backache, no apparent nausea or vomiting and able to ambulate Anesthetic complications: no    Last Vitals:  Vitals:   08/01/18 1300 08/01/18 1400  BP: (!) 100/59 107/61  Pulse: 89 90  Resp: 18 16  Temp: 36.9 C 36.9 C  SpO2: 98% 96%    Last Pain:  Vitals:   08/01/18 1400  TempSrc: Oral  PainSc: 0-No pain   Pain Goal:                Epidural/Spinal Function Cutaneous sensation: Tingles (08/01/18 1400), Patient able to flex knees: Yes (08/01/18 1400), Patient able to lift hips off bed: Yes (08/01/18 1400), Back pain beyond tenderness at insertion site: No (08/01/18 1400), Progressively worsening motor and/or sensory loss: No (08/01/18 1400), Bowel and/or bladder incontinence post epidural: No (08/01/18 1400)  Trevor Iha

## 2018-08-01 NOTE — Brief Op Note (Signed)
08/01/2018  8:52 AM  PATIENT:  Vanessa [name]  42 y.o. female  PRE-OPERATIVE DIAGNOSIS:  PREVIOUS CESAREAN SECTION , term gestation, AMA  POST-OPERATIVE DIAGNOSIS: same  PROCEDURE:  Repeat C/S, kerr hysterotomy  SURGEON:  Surgeon(s) and Role:    * Maxie Better, MD - Primary  PHYSICIAN ASSISTANT:   ASSISTANTS: Arlan Organ, CNM   ANESTHESIA:   spinal FINDINGS: LIVE FEMALE rot, NL TUBES AND OVARIES, apgar 9/9 EBL:  437 mL   BLOOD ADMINISTERED:none  DRAINS: none   LOCAL MEDICATIONS USED:  OTHER marcaine with kenalog  SPECIMEN:  Source of Specimen:  placenta  DISPOSITION OF SPECIMEN:  N/A  COUNTS:  YES  TOURNIQUET:  * No tourniquets in log *  DICTATION: .Other Dictation: Dictation Number [id number]  PLAN OF CARE: Admit to inpatient   PATIENT DISPOSITION:  PACU - hemodynamically stable.   Delay start of Pharmacological VTE agent (>24hrs) due to surgical blood loss or risk of bleeding: no

## 2018-08-01 NOTE — Lactation Note (Signed)
This note was copied from a baby's chart. Lactation Consultation Note  Patient Name: [name] [id number] Date: 08/01/2018 Reason for consult: Initial assessment;Term;1st time breastfeeding  P2 mother whose infant is now 56 hours old.  Mother did not breast feed her first child (now 41 years old)  Mother had baby latched in the cradle hold on the left breast when I arrived.  Baby was actively sucking and positioning appeared good.  Mother denied pain with latching.  Discussed ways to keep baby awake at the breast during the early hours after birth.  Mother demonstrated gentle stimulation and baby reacted by continuing to suck.  Encouraged to feed 8-12 times/24 hours or sooner if baby shows feeding cues.  Mother is familiar with feeding cues.  She also has seen colostrum drops with hand expression.  Colostrum container provided for any EBM she obtains with hand expression.  Milk storage times reviewed and finger feeding demonstrated.  Mom made aware of O/P services, breastfeeding support groups, community resources, and our phone # for post-discharge questions.  Virtual support group flyer provided.  Mother will return to work in 12 weeks and already has a DEBP for home use.  Father present.  Both parents receptive to learning and asked appropriate questions.   Maternal Data Formula Feeding for Exclusion: No Has patient been taught Hand Expression?: Yes Does the patient have breastfeeding experience prior to this delivery?: No  Feeding Feeding Type: Breast Fed  LATCH Score Latch: Grasps breast easily, tongue down, lips flanged, rhythmical sucking.  Audible Swallowing: None  Type of Nipple: Everted at rest and after stimulation  Comfort (Breast/Nipple): Soft / non-tender  Hold (Positioning): No assistance needed to correctly position infant at breast.  LATCH Score: 8  Interventions Interventions: Breast feeding basics reviewed;Skin to skin  Lactation Tools Discussed/Used WIC  Program: No   Consult Status Consult Status: Follow-up Date: 08/02/18 Follow-up type: In-patient    Dora Sims 08/01/2018, 5:35 PM

## 2018-08-02 LAB — CBC
HCT: 27.9 % — ABNORMAL LOW (ref 36.0–46.0)
Hemoglobin: 9.3 g/dL — ABNORMAL LOW (ref 12.0–15.0)
MCH: 31.3 pg (ref 26.0–34.0)
MCHC: 33.3 g/dL (ref 30.0–36.0)
MCV: 93.9 fL (ref 80.0–100.0)
Platelets: 176 10*3/uL (ref 150–400)
RBC: 2.97 MIL/uL — ABNORMAL LOW (ref 3.87–5.11)
RDW: 13 % (ref 11.5–15.5)
WBC: 11.9 10*3/uL — ABNORMAL HIGH (ref 4.0–10.5)
nRBC: 0 % (ref 0.0–0.2)

## 2018-08-02 MED ORDER — IBUPROFEN 600 MG PO TABS
600.0000 mg | ORAL_TABLET | Freq: Four times a day (QID) | ORAL | Status: DC
  Administered 2018-08-02 – 2018-08-03 (×4): 600 mg via ORAL
  Filled 2018-08-02 (×4): qty 1

## 2018-08-02 NOTE — Progress Notes (Signed)
POSTOPERATIVE DAY # 1 S/P repeat CS  S:        Reports feeling really sore - no sharp pain / pain meds working ok             tolerating po intake / no nausea / no vomiting / no flatus / no BM             Up ad lib / ambulatory / voiding this AM  Newborn Breast   O:  VS: BP 95/60   Pulse 88   Temp 98.4 F (36.9 C) (Oral)   Resp 16   Ht 5' (1.524 m)   Wt 73.5 kg   LMP 10/26/2017   SpO2 98%   Breastfeeding Unknown   BMI 31.66 kg/m   LABS:              Recent Labs    08/01/18 0515 08/01/18 0738  WBC 9.8  --   HGB 13.8 11.9*  PLT 242  --                Bloodtype: --/--/B POS, B POS (05/14 0545)  Rubella: Immune (10/14 0000)                                          I&O: Intake/Output      05/14 0701 - 05/15 0700 05/15 0701 - 05/16 0700   P.O. 640    I.V. (mL/kg) 1104 (15)    Total Intake(mL/kg) 1744 (23.7)    Urine (mL/kg/hr) 1775 (1)    Blood 537    Total Output 2312    Net -568          Physical Exam:             Alert and Oriented X3  Lungs: Clear and unlabored  Heart: regular rate and rhythm / no mumurs  Abdomen: soft, non-tender, mild distention             Fundus: firm, non-tender, Ueven             Dressing pressure dressing intact without drainage          Lochia: scant  Extremities: no edema, no calf pain or tenderness  A/P:    POD # 1 S/P repeat CS            routine postoperative care             warm fluids / shower / ambulate / k-pad to abdomen to facilitate bowel motility            Interested in dc home tomorrow  Marlinda Mike CNM, MSN, [facility] 08/02/2018, 8:03 AM

## 2018-08-03 ENCOUNTER — Encounter (HOSPITAL_COMMUNITY): Payer: Self-pay | Admitting: Obstetrics and Gynecology

## 2018-08-03 MED ORDER — BACITRACIN-NEOMYCIN-POLYMYXIN OINTMENT TUBE
TOPICAL_OINTMENT | Freq: Two times a day (BID) | CUTANEOUS | Status: DC | PRN
  Administered 2018-08-03: 13:00:00 via TOPICAL
  Filled 2018-08-03: qty 14

## 2018-08-03 MED ORDER — SENNOSIDES-DOCUSATE SODIUM 8.6-50 MG PO TABS: 2.0000 | ORAL_TABLET | ORAL | 0 refills | Status: DC

## 2018-08-03 MED ORDER — ACETAMINOPHEN 500 MG PO TABS: 1000.0000 mg | ORAL_TABLET | Freq: Four times a day (QID) | ORAL | 0 refills | Status: DC

## 2018-08-03 MED ORDER — OXYCODONE HCL 5 MG PO TABS: 5.0000 mg | ORAL_TABLET | ORAL | 0 refills | Status: DC | PRN

## 2018-08-03 MED ORDER — IBUPROFEN 800 MG PO TABS: 800.0000 mg | ORAL_TABLET | Freq: Three times a day (TID) | ORAL | 0 refills | Status: DC | PRN

## 2018-08-03 MED ORDER — POLYSACCHARIDE IRON COMPLEX 150 MG PO CAPS: 150.0000 mg | ORAL_CAPSULE | Freq: Every day | ORAL | 3 refills | Status: AC

## 2018-08-03 MED ORDER — POLYSACCHARIDE IRON COMPLEX 150 MG PO CAPS
150.0000 mg | ORAL_CAPSULE | Freq: Every day | ORAL | Status: DC
  Administered 2018-08-03: 150 mg via ORAL
  Filled 2018-08-03: qty 1

## 2018-08-03 NOTE — Lactation Note (Signed)
This note was copied from a baby's chart. Lactation Consultation Note  Patient Name: Vanessa [name] [id number] Date: 08/03/2018 Reason for consult: Follow-up assessment;Term Baby is 50 hours old/8% weight loss.  Mom reports feedings are going well.  She has a history of a low milk supply with first baby.  Instructed to feed with cues and post pump x 15 minutes every other feeding.  Discussed milk coming to volume and the prevention and treatment of engorgement.  Questions answered.  Lactation outpatient services reviewed and encouraged prn.  Maternal Data    Feeding Feeding Type: Breast Fed  LATCH Score                   Interventions    Lactation Tools Discussed/Used     Consult Status Consult Status: Complete Follow-up type: Call as needed    Huston Foley 08/03/2018, 10:57 AM

## 2018-08-03 NOTE — Progress Notes (Signed)
POSTOPERATIVE DAY # 2 S/P Repeat LTCS, baby girl "Cassie"   S:         Reports feeling more sore today, but tolerable with Motrin and Tylenol.  Considering taking Oxycodone and desires rx for narcotic in case she needs it.   C/o painful blisters on right side of abdomen from pressure dressing tape.              Tolerating po intake / no nausea / no vomiting / + flatus / + BM  Denies dizziness, SOB, or CP             Bleeding is light             Up ad lib / ambulatory/ voiding QS  Newborn breast feeding - going well per mom, (+) colostrum; c/o sore nipples. Was unable to breastfeed son, but states it is going better this time.    O:  VS: BP 122/65   Pulse 75   Temp 98.3 F (36.8 C) (Oral)   Resp 18   Ht 5' (1.524 m)   Wt 73.5 kg   LMP 10/26/2017   SpO2 99%   Breastfeeding Unknown   BMI 31.66 kg/m    LABS:               Recent Labs    08/01/18 0515 08/01/18 0738 08/02/18 0639  WBC 9.8  --  11.9*  HGB 13.8 11.9* 9.3*  PLT 242  --  176               Bloodtype: --/--/B POS, B POS (05/14 0545)  Rubella: Immune (10/14 0000)                                             I&O: Intake/Output      05/15 0701 - 05/16 0700 05/16 0701 - 05/17 0700   P.O. 1500    I.V. (mL/kg)     Total Intake(mL/kg) 1500 (20.4)    Urine (mL/kg/hr) 1200 (0.7)    Blood     Total Output 1200    Net +300                      Physical Exam:             Alert and Oriented X3  Lungs: Clear and unlabored  Heart: regular rate and rhythm / no murmurs  Abdomen: soft, non-tender, non-distended, active bowel sounds in all quadrant; cluster of vesicles noted on right lower abdomen where pressure dressing tape was             Fundus: firm, non-tender, U-1             Dressing: honeycomb with steri-strips middle of dressing marked with sanguinous blood, no active drainage                Incision:  approximated with sutures / no erythema / no ecchymosis / no active drainage  Perineum: intact  Lochia: scant  amount on pad   Extremities: +1 pitting LE edema, no calf pain or tenderness,   A:        POD # 2 S/P Repeat LTCS             ABL Anemia   P:        Routine postoperative care  Begin Niferex 150mg  PO daily  Neosporin twice daily as needed to vesicles; okay to keep covered due to pain  See lactation prior to discharge  Discharge home today   [facility] discharge book given, instructions and warning s/s reviewed   F/u with Dr. Cherly Hensenousins in 6 weeks   Carlean JewsMeredith Libra Gatz, MSN, CNM [facility]

## 2018-08-03 NOTE — Discharge Summary (Signed)
Obstetric Discharge Summary   Patient Name: Vanessa [name] DOB: 1975/12/25 MRN: [id number]  Date of Admission: 08/01/2018 Date of Discharge: 08/03/2018 Date of Delivery: 08/01/2018 Gestational Age at Delivery: [redacted]w[redacted]d  Primary OB: [facility] - Dr. Cherly Hensen  Antepartum complications:  - Prior LTCS - GERD - AMA - Unilateral isolated left CPC - Complete previa - resolved  Prenatal Labs:  ABO, Rh: B Pos (05/14 0545) Antibody: Negative (05/14 0545) Rubella: Immune (10/14 0000) RPR: Nonreactive (10/14 0000)  HBsAg: Negative (10/14 0000)  HIV: Non-reactive (10/14 0000)  GBS: Positive (04/23 0000)   Admitting Diagnosis: Repeat LTCS at 39+6 weeks   Secondary Diagnoses: Patient Active Problem List   Diagnosis Date Noted  . Previous cesarean section 08/01/2018  . Postpartum care following cesarean delivery (5/14) 04/26/2016    Date of Delivery: 08/01/2018 Delivered By: Dr. Cherly Hensen D. Renae Fickle, CNM assist Delivery Type: repeat cesarean section, low transverse incision Anesthesia: spinal  Newborn Data: Live born female  Birth Weight: 7 lb 15.3 oz (3610 g) APGAR: 9, 9  Newborn Delivery   Birth date/time:  08/01/2018 08:10:00 Delivery type:  C-Section, Low Transverse Trial of labor:  No C-section categorization:  Repeat        Hospital/Postpartum Course  (Cesarean Section):  Pt. Admitted at term for repeat C/S for AMA.  See operative note for further details.  Patient had an uncomplicated postpartum course.  By time of discharge on POD#2, her pain was controlled on oral pain medications; she had appropriate lochia and was ambulating, voiding without difficulty, tolerating regular diet and passing flatus.   She was deemed stable for discharge to home.     Labs: CBC Latest Ref Rng & Units 08/02/2018 08/01/2018 08/01/2018  WBC 4.0 - 10.5 K/uL 11.9(H) - 9.8  Hemoglobin 12.0 - 15.0 g/dL 8.4(O) 11.9(L) 13.8  Hematocrit 36.0 - 46.0 % 27.9(L) 35.0(L) 41.5  Platelets 150 - 400 K/uL  176 - 242   B POS  Physical exam:  BP 122/65   Pulse 75   Temp 98.3 F (36.8 C) (Oral)   Resp 18   Ht 5' (1.524 m)   Wt 73.5 kg   LMP 10/26/2017   SpO2 99%   Breastfeeding Unknown   BMI 31.66 kg/m  General: alert and no distress Pulm: normal respiratory effort Lochia: appropriate Abdomen: soft, NT Uterine Fundus: firm, below umbilicus Perineum: healing well, no significant erythema, no significant edema Incision: c/d/i, healing well, no significant drainage, no dehiscence, no significant erythema; cluster of vesicles noted on right lower abdomen where pressure dressing tape was Extremities: No evidence of DVT seen on physical exam. +1 pitting lower extremity edema.   Disposition: stable, discharge to home Baby Feeding: breast milk Baby Disposition: home with mom  Contraception: discussed but undecided  Rh Immune globulin given: N/A Rubella vaccine given: N/A Tdap vaccine given in AP or PP setting: UTD Flu vaccine given in AP or PP setting: UTD   Plan:  Naiyana Tibbett was discharged to home in good condition. Follow-up appointment at [facility] in 6 weeks.  Discharge Instructions: Per After Visit Summary. Refer to After Visit Summary and [facility] discharge booklet  Activity: Advance as tolerated. Pelvic rest for 6 weeks.   Diet: Regular, Heart Healthy Discharge Medications: Allergies as of 08/03/2018      Reactions   Fluconazole Nausea And Vomiting, Other (See Comments)   thrush      Medication List    TAKE these medications   acetaminophen 500 MG tablet Commonly known  as:  TYLENOL Take 2 tablets (1,000 mg total) by mouth every 6 (six) hours.   ibuprofen 800 MG tablet Commonly known as:  ADVIL Take 1 tablet (800 mg total) by mouth every 8 (eight) hours as needed.   iron polysaccharides 150 MG capsule Commonly known as:  NIFEREX Take 1 capsule (150 mg total) by mouth daily.   oxyCODONE 5 MG immediate release tablet Commonly known as:  Oxy  IR/ROXICODONE Take 1-2 tablets (5-10 mg total) by mouth every 4 (four) hours as needed for moderate pain.   prenatal multivitamin Tabs tablet Take 1 tablet by mouth daily.   senna-docusate 8.6-50 MG tablet Commonly known as:  Senokot-S Take 2 tablets by mouth daily. Start taking on:  Aug 04, 2018      Outpatient follow up:  Follow-up Information    Maxie Betterousins, Sheronette, MD. Schedule an appointment as soon as possible for a visit in 6 week(s).   Specialty:  Obstetrics and Gynecology Why:  Postpartum visit Contact information: [address] [place] [address] [phone]           Signed:  Carlean JewsMeredith Arena Lindahl, MSN, CNM [facility]

## 2018-08-03 NOTE — Progress Notes (Signed)
Patient had blisters and peeling skin right abdomen, noticed burning over night last night and pt will ask dr during rounds about care for this area.

## 2018-09-13 DIAGNOSIS — Z130 Encounter for screening for diseases of the blood and blood-forming organs and certain disorders involving the immune mechanism: Secondary | ICD-10-CM | POA: Diagnosis not present

## 2019-04-29 IMAGING — CR DG CHEST 2V
2 series · 2 of 2 positions shown · non-contrast
Comparison: None.

CLINICAL DATA: Intermittent chest pain for several weeks.

EXAM:
CHEST  2 VIEW

[w chest pa]
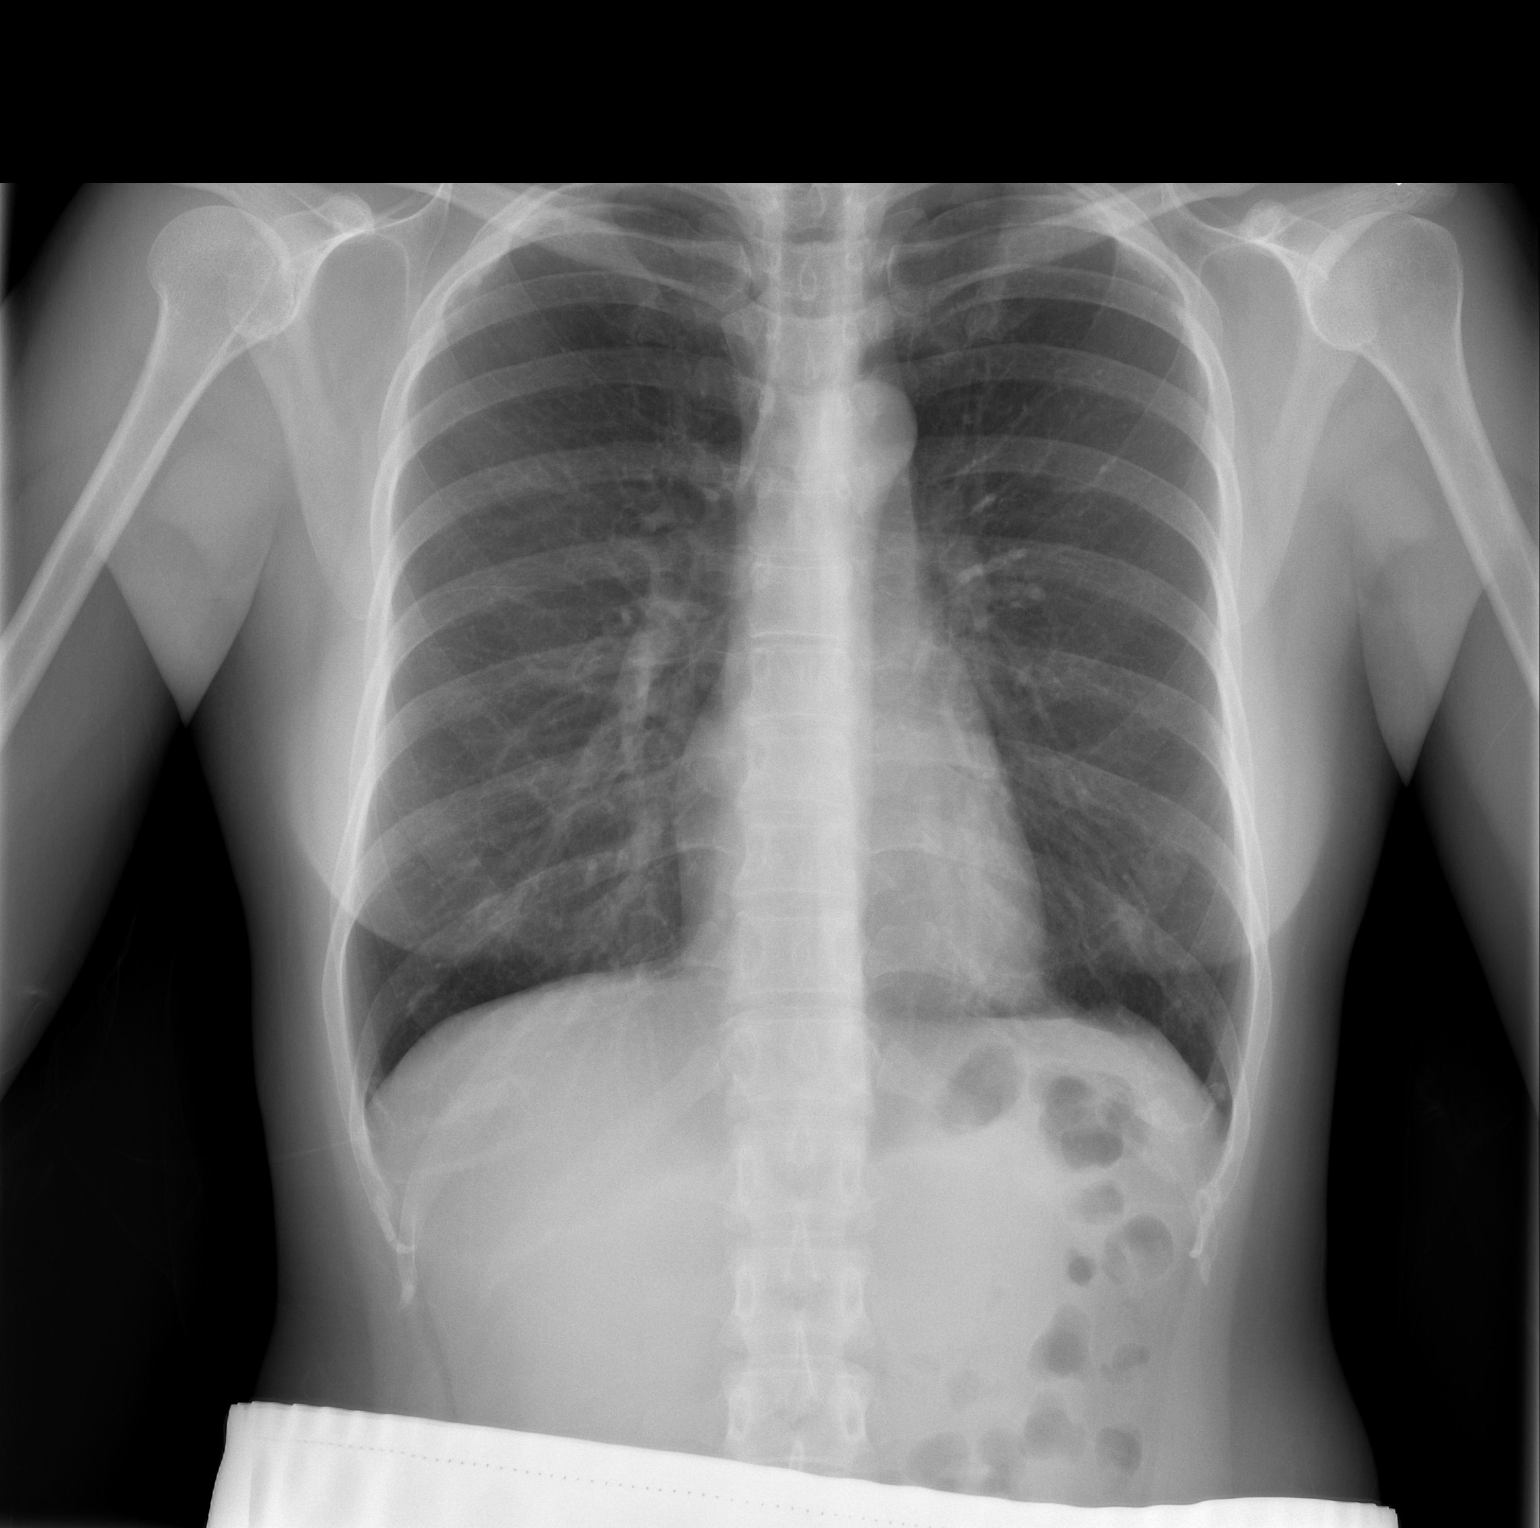

[w chest lat]
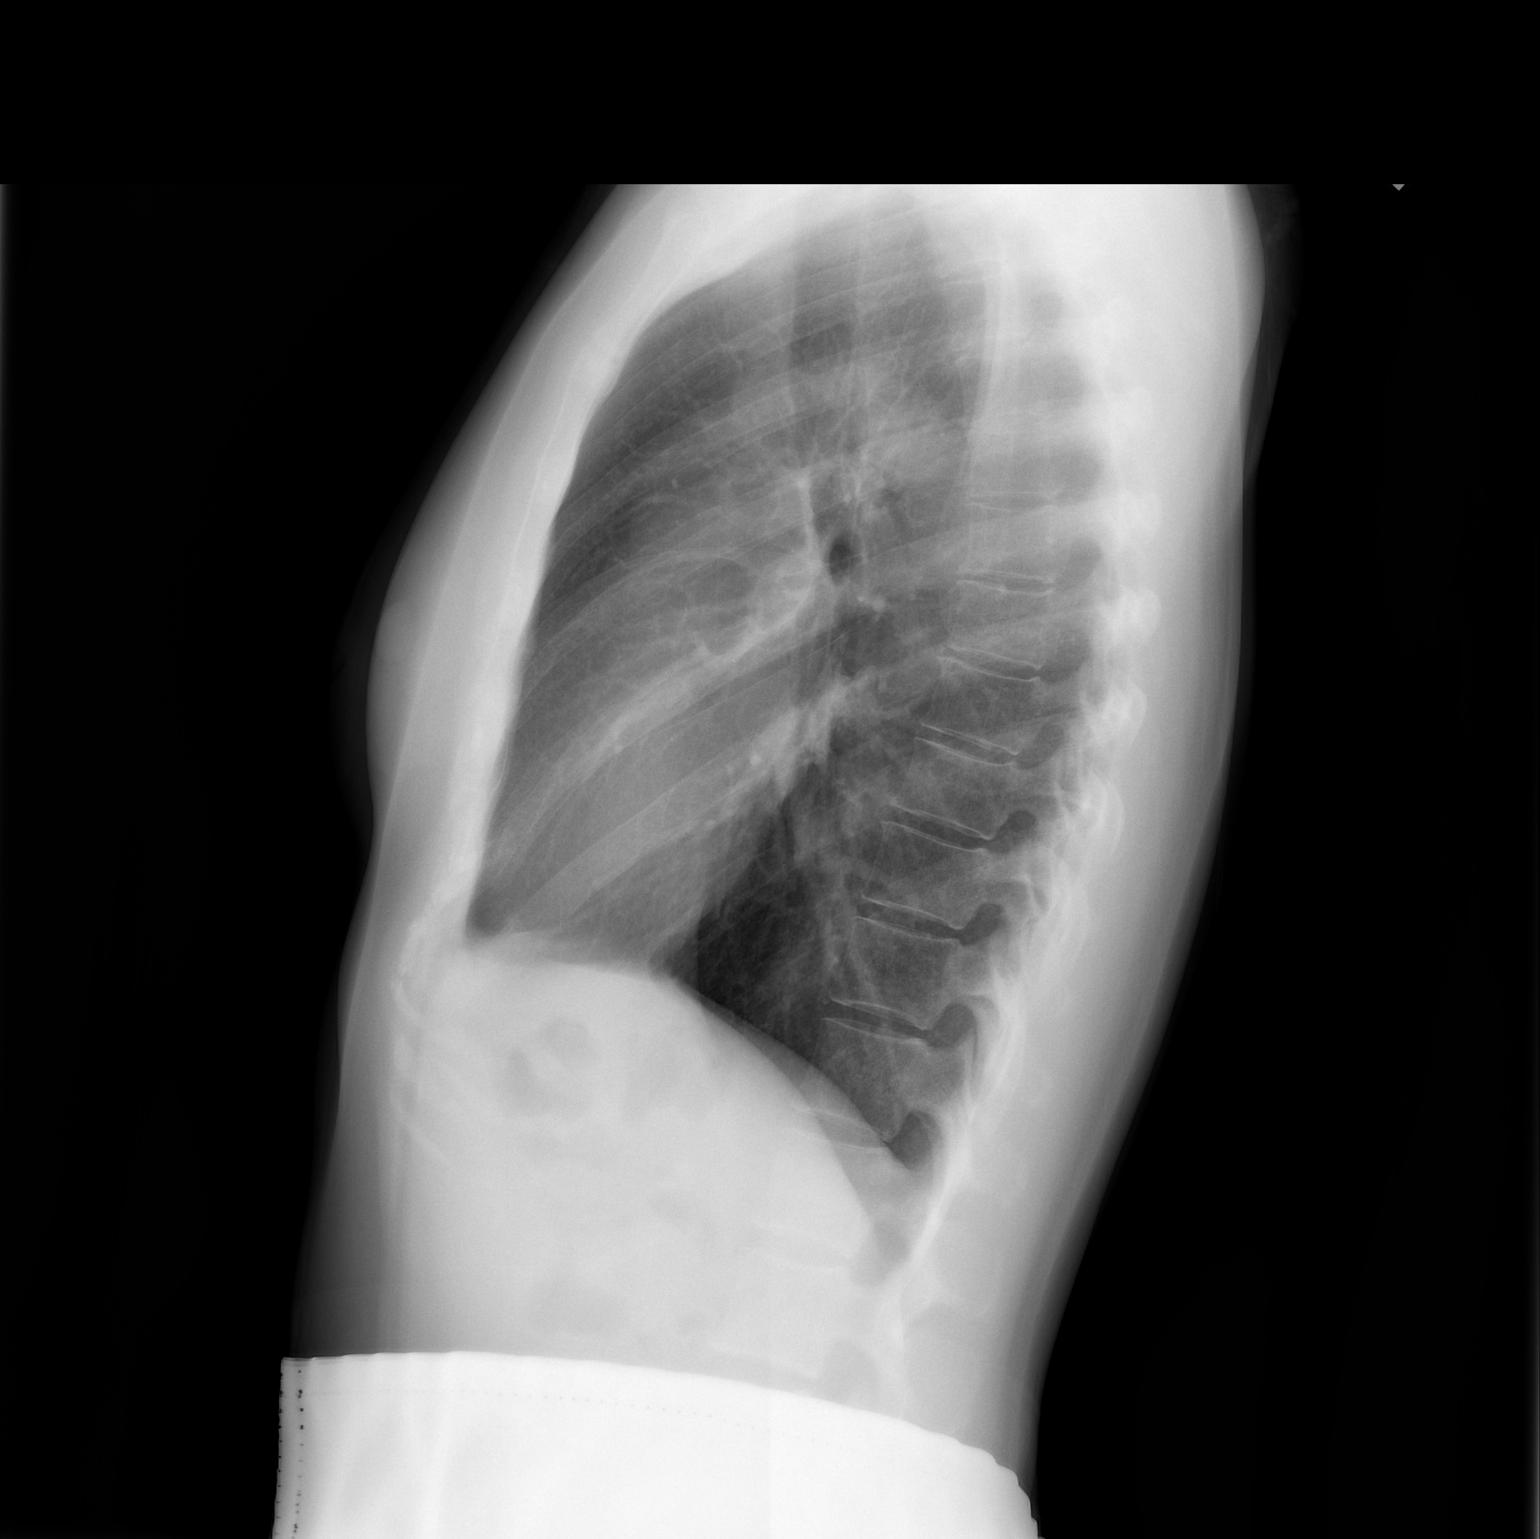

[2 of 2 positions shown; findings below may reference images not displayed]

FINDINGS: Normal sized heart. Clear lungs with normal vascularity. Minimal
thoracic spine degenerative changes.
IMPRESSION: No acute abnormality.

## 2020-07-16 ENCOUNTER — Other Ambulatory Visit (HOSPITAL_BASED_OUTPATIENT_CLINIC_OR_DEPARTMENT_OTHER): Payer: Self-pay

## 2020-07-16 ENCOUNTER — Ambulatory Visit: Payer: BLUE CROSS/BLUE SHIELD | Attending: Internal Medicine

## 2020-07-16 DIAGNOSIS — Z23 Encounter for immunization: Secondary | ICD-10-CM

## 2020-07-16 MED ORDER — PFIZER-BIONT COVID-19 VAC-TRIS 30 MCG/0.3ML IM SUSP
INTRAMUSCULAR | 0 refills | Status: DC
  Filled 2020-07-16: qty 0.3, 1d supply, fill #0

## 2020-07-16 NOTE — Progress Notes (Signed)
   Covid-19 Vaccination Clinic  Name:  Vanessa [name]    MRN: [id number] DOB: Sep 09, 1975  07/16/2020  [name] was observed post Covid-19 immunization for 15 minutes without incident. She was provided with Vaccine Information Sheet and instruction to access the V-Safe system.   Ms. Heater was instructed to call 911 with any severe reactions post vaccine: Marland Kitchen Difficulty breathing  . Swelling of face and throat  . A fast heartbeat  . A bad rash all over body  . Dizziness and weakness   Immunizations Administered    Name Date Dose VIS Date Route   PFIZER Comrnaty(Gray TOP) Covid-19 Vaccine 07/16/2020  9:52 AM 0.3 mL 02/26/2020 Intramuscular   Manufacturer: [employer], [employer]   Lot: [id number]   NDC: [phone]

## 2021-01-05 ENCOUNTER — Other Ambulatory Visit: Payer: Self-pay | Admitting: Family Medicine

## 2021-01-05 ENCOUNTER — Other Ambulatory Visit (HOSPITAL_COMMUNITY)
Admission: RE | Admit: 2021-01-05 | Discharge: 2021-01-05 | Disposition: A | Discharge status: Home / Self Care | Payer: No Typology Code available for payment source | Source: Ambulatory Visit | Attending: Family Medicine | Admitting: Family Medicine

## 2021-01-05 DIAGNOSIS — Z01411 Encounter for gynecological examination (general) (routine) with abnormal findings: Secondary | ICD-10-CM | POA: Diagnosis present

## 2021-01-06 LAB — CYTOLOGY - PAP
Comment: NEGATIVE
Diagnosis: NEGATIVE
High risk HPV: NEGATIVE

## 2022-03-09 DIAGNOSIS — Z13220 Encounter for screening for lipoid disorders: Secondary | ICD-10-CM | POA: Diagnosis not present

## 2022-03-09 DIAGNOSIS — D649 Anemia, unspecified: Secondary | ICD-10-CM | POA: Diagnosis not present

## 2022-03-09 DIAGNOSIS — E559 Vitamin D deficiency, unspecified: Secondary | ICD-10-CM | POA: Diagnosis not present

## 2022-03-09 DIAGNOSIS — Z0000 Encounter for general adult medical examination without abnormal findings: Secondary | ICD-10-CM | POA: Diagnosis not present

## 2022-04-12 DIAGNOSIS — D649 Anemia, unspecified: Secondary | ICD-10-CM | POA: Diagnosis not present

## 2022-10-22 ENCOUNTER — Other Ambulatory Visit: Payer: Self-pay | Admitting: Obstetrics and Gynecology

## 2022-10-23 ENCOUNTER — Encounter (HOSPITAL_BASED_OUTPATIENT_CLINIC_OR_DEPARTMENT_OTHER): Payer: Self-pay | Admitting: Obstetrics and Gynecology

## 2022-10-23 NOTE — Progress Notes (Signed)
Spoke w/ via phone for pre-op interview--- Vanessa [name] needs dos----   CBC and UPT per anesthesia            [name] results------ COVID test -----patient states asymptomatic no test needed Arrive at -------1015 NPO after MN NO Solid Food.  Clear liquids from MN until---0915 Med rec completed Medications to take morning of surgery -----NONE Diabetic medication ----- Patient instructed no nail polish to be worn day of surgery Patient instructed to bring photo id and insurance card day of surgery Patient aware to have Driver (ride ) / caregiver  Fiance Vanessa [name]  for 24 hours after surgery  Patient Special Instructions ----- Pre-Op special Instructions ----- Patient verbalized understanding of instructions that were given at this phone interview. Patient denies shortness of breath, chest pain, fever, cough at this phone interview.

## 2022-11-03 ENCOUNTER — Ambulatory Visit (HOSPITAL_BASED_OUTPATIENT_CLINIC_OR_DEPARTMENT_OTHER): Payer: BC Managed Care – PPO | Admitting: Anesthesiology

## 2022-11-03 ENCOUNTER — Ambulatory Visit (HOSPITAL_BASED_OUTPATIENT_CLINIC_OR_DEPARTMENT_OTHER)
Admission: RE | Admit: 2022-11-03 | Discharge: 2022-11-03 | Disposition: A | Discharge status: Home / Self Care | Payer: BC Managed Care – PPO | Attending: Obstetrics and Gynecology | Admitting: Obstetrics and Gynecology

## 2022-11-03 ENCOUNTER — Other Ambulatory Visit: Payer: Self-pay

## 2022-11-03 ENCOUNTER — Encounter (HOSPITAL_BASED_OUTPATIENT_CLINIC_OR_DEPARTMENT_OTHER): Payer: Self-pay | Admitting: Obstetrics and Gynecology

## 2022-11-03 ENCOUNTER — Encounter (HOSPITAL_BASED_OUTPATIENT_CLINIC_OR_DEPARTMENT_OTHER)
Admission: RE | Disposition: A | Discharge status: Home / Self Care | Payer: Self-pay | Source: Home / Self Care | Attending: Obstetrics and Gynecology

## 2022-11-03 DIAGNOSIS — N840 Polyp of corpus uteri: Secondary | ICD-10-CM | POA: Insufficient documentation

## 2022-11-03 DIAGNOSIS — N921 Excessive and frequent menstruation with irregular cycle: Secondary | ICD-10-CM | POA: Insufficient documentation

## 2022-11-03 HISTORY — PX: DILATATION & CURETTAGE/HYSTEROSCOPY WITH MYOSURE: SHX6511

## 2022-11-03 LAB — CBC
HCT: 40 % (ref 36.0–46.0)
Hemoglobin: 12.8 g/dL (ref 12.0–15.0)
MCH: 27.4 pg (ref 26.0–34.0)
MCHC: 32 g/dL (ref 30.0–36.0)
MCV: 85.7 fL (ref 80.0–100.0)
Platelets: 306 10*3/uL (ref 150–400)
RBC: 4.67 MIL/uL (ref 3.87–5.11)
RDW: 12 % (ref 11.5–15.5)
WBC: 7.1 10*3/uL (ref 4.0–10.5)
nRBC: 0 % (ref 0.0–0.2)

## 2022-11-03 LAB — POCT PREGNANCY, URINE: Preg Test, Ur: NEGATIVE

## 2022-11-03 SURGERY — DILATATION & CURETTAGE/HYSTEROSCOPY WITH MYOSURE
Anesthesia: General

## 2022-11-03 MED ORDER — ONDANSETRON HCL 4 MG/2ML IJ SOLN
INTRAMUSCULAR | Status: AC
  Filled 2022-11-03: qty 2

## 2022-11-03 MED ORDER — OXYCODONE HCL 5 MG PO TABS: 5.0000 mg | ORAL_TABLET | Freq: Once | ORAL | Status: DC | PRN

## 2022-11-03 MED ORDER — KETOROLAC TROMETHAMINE 30 MG/ML IJ SOLN
INTRAMUSCULAR | Status: AC
  Filled 2022-11-03: qty 1

## 2022-11-03 MED ORDER — DEXAMETHASONE SODIUM PHOSPHATE 10 MG/ML IJ SOLN
INTRAMUSCULAR | Status: AC
  Filled 2022-11-03: qty 1

## 2022-11-03 MED ORDER — MIDAZOLAM HCL 2 MG/2ML IJ SOLN
INTRAMUSCULAR | Status: DC | PRN
  Administered 2022-11-03: 2 mg via INTRAVENOUS

## 2022-11-03 MED ORDER — SODIUM CHLORIDE 0.9 % IR SOLN
Status: DC | PRN
  Administered 2022-11-03: 6000 mL

## 2022-11-03 MED ORDER — PROMETHAZINE HCL 25 MG/ML IJ SOLN: 6.2500 mg | INTRAMUSCULAR | Status: DC | PRN

## 2022-11-03 MED ORDER — LIDOCAINE 2% (20 MG/ML) 5 ML SYRINGE
INTRAMUSCULAR | Status: DC | PRN
  Administered 2022-11-03: 60 mg via INTRAVENOUS

## 2022-11-03 MED ORDER — PROPOFOL 10 MG/ML IV BOLUS
INTRAVENOUS | Status: AC
  Filled 2022-11-03: qty 20

## 2022-11-03 MED ORDER — OXYCODONE HCL 5 MG/5ML PO SOLN: 5.0000 mg | Freq: Once | ORAL | Status: DC | PRN

## 2022-11-03 MED ORDER — DEXAMETHASONE SODIUM PHOSPHATE 10 MG/ML IJ SOLN
INTRAMUSCULAR | Status: DC | PRN
  Administered 2022-11-03: 5 mg via INTRAVENOUS

## 2022-11-03 MED ORDER — FENTANYL CITRATE (PF) 100 MCG/2ML IJ SOLN
INTRAMUSCULAR | Status: DC | PRN
  Administered 2022-11-03: 50 ug via INTRAVENOUS

## 2022-11-03 MED ORDER — ACETAMINOPHEN 500 MG PO TABS
ORAL_TABLET | ORAL | Status: AC
  Filled 2022-11-03: qty 2

## 2022-11-03 MED ORDER — POVIDONE-IODINE 10 % EX SWAB: 2.0000 | Freq: Once | CUTANEOUS | Status: DC

## 2022-11-03 MED ORDER — FENTANYL CITRATE (PF) 100 MCG/2ML IJ SOLN
INTRAMUSCULAR | Status: AC
  Filled 2022-11-03: qty 2

## 2022-11-03 MED ORDER — KETOROLAC TROMETHAMINE 30 MG/ML IJ SOLN
INTRAMUSCULAR | Status: DC | PRN
  Administered 2022-11-03: 30 mg via INTRAVENOUS

## 2022-11-03 MED ORDER — FENTANYL CITRATE (PF) 100 MCG/2ML IJ SOLN: 25.0000 ug | INTRAMUSCULAR | Status: DC | PRN

## 2022-11-03 MED ORDER — ONDANSETRON HCL 4 MG/2ML IJ SOLN
INTRAMUSCULAR | Status: DC | PRN
  Administered 2022-11-03: 4 mg via INTRAVENOUS

## 2022-11-03 MED ORDER — PROPOFOL 10 MG/ML IV BOLUS
INTRAVENOUS | Status: DC | PRN
Start: 2022-11-03 — End: 2022-11-03
  Administered 2022-11-03: 130 mg via INTRAVENOUS

## 2022-11-03 MED ORDER — LIDOCAINE HCL (PF) 2 % IJ SOLN
INTRAMUSCULAR | Status: AC
  Filled 2022-11-03: qty 5

## 2022-11-03 MED ORDER — LACTATED RINGERS IV SOLN: INTRAVENOUS | Status: DC

## 2022-11-03 MED ORDER — ACETAMINOPHEN 500 MG PO TABS
1000.0000 mg | ORAL_TABLET | Freq: Once | ORAL | Status: AC
  Administered 2022-11-03: 1000 mg via ORAL

## 2022-11-03 MED ORDER — MIDAZOLAM HCL 2 MG/2ML IJ SOLN
INTRAMUSCULAR | Status: AC
  Filled 2022-11-03: qty 2

## 2022-11-03 MED ORDER — IBUPROFEN 800 MG PO TABS: 800.0000 mg | ORAL_TABLET | Freq: Three times a day (TID) | ORAL | 4 refills | Status: AC | PRN

## 2022-11-03 SURGICAL SUPPLY — 22 items
CATH ROBINSON RED A/P 16FR (CATHETERS) IMPLANT
DEVICE MYOSURE LITE (MISCELLANEOUS) IMPLANT
DEVICE MYOSURE REACH (MISCELLANEOUS) IMPLANT
DILATOR CANAL MILEX (MISCELLANEOUS) IMPLANT
DRSG TELFA 3X8 NADH STRL (GAUZE/BANDAGES/DRESSINGS) ×1 IMPLANT
GAUZE 4X4 16PLY ~~LOC~~+RFID DBL (SPONGE) ×1 IMPLANT
GLOVE BIOGEL PI IND STRL 7.0 (GLOVE) ×1 IMPLANT
GLOVE ECLIPSE 6.5 STRL STRAW (GLOVE) ×1 IMPLANT
GOWN STRL REUS W/TWL LRG LVL3 (GOWN DISPOSABLE) ×1 IMPLANT
IV NS IRRIG 3000ML ARTHROMATIC (IV SOLUTION) ×1 IMPLANT
KIT PROCEDURE FLUENT (KITS) ×1 IMPLANT
KIT TURNOVER CYSTO (KITS) ×1 IMPLANT
MYOSURE XL FIBROID (MISCELLANEOUS)
PACK VAGINAL MINOR WOMEN LF (CUSTOM PROCEDURE TRAY) ×1 IMPLANT
PAD OB MATERNITY 4.3X12.25 (PERSONAL CARE ITEMS) ×1 IMPLANT
PAD PREP 24X48 CUFFED NSTRL (MISCELLANEOUS) ×1 IMPLANT
SEAL CERVICAL OMNI LOK (ABLATOR) IMPLANT
SEAL ROD LENS SCOPE MYOSURE (ABLATOR) ×1 IMPLANT
SLEEVE SCD COMPRESS KNEE MED (STOCKING) ×1 IMPLANT
SYSTEM TISS REMOVAL MYOSURE XL (MISCELLANEOUS) IMPLANT
TOWEL OR 17X24 6PK STRL BLUE (TOWEL DISPOSABLE) ×1 IMPLANT
WATER STERILE IRR 500ML POUR (IV SOLUTION) ×1 IMPLANT

## 2022-11-03 NOTE — Anesthesia Preprocedure Evaluation (Addendum)
Anesthesia Evaluation  Patient identified by MRN, date of birth, ID band Patient awake    Reviewed: Allergy & Precautions, NPO status , Patient's Chart, lab work & pertinent test results  History of Anesthesia Complications Negative for: history of anesthetic complications  Airway Mallampati: III  TM Distance: >3 FB Neck ROM: Full    Dental  (+) Dental Advisory Given   Pulmonary neg pulmonary ROS   Pulmonary exam normal breath sounds clear to auscultation       Cardiovascular negative cardio ROS  Rhythm:Regular Rate:Normal     Neuro/Psych negative neurological ROS     GI/Hepatic Neg liver ROS,GERD  ,,  Endo/Other  negative endocrine ROS    Renal/GU negative Renal ROS     Musculoskeletal   Abdominal   Peds  Hematology negative hematology ROS (+)   Anesthesia Other Findings   Reproductive/Obstetrics                              Anesthesia Physical Anesthesia Plan  ASA: 2  Anesthesia Plan: General   Post-op Pain Management: Tylenol PO (pre-op)*   Induction: Intravenous  PONV Risk Score and Plan: 3 and Ondansetron, Dexamethasone and Treatment may vary due to age or medical condition  Airway Management Planned: LMA  Additional Equipment:   Intra-op Plan:   Post-operative Plan: Extubation in OR  Informed Consent: I have reviewed the patients History and Physical, chart, labs and discussed the procedure including the risks, benefits and alternatives for the proposed anesthesia with the patient or authorized representative who has indicated his/her understanding and acceptance.     Dental advisory given  Plan Discussed with: CRNA and Anesthesiologist  Anesthesia Plan Comments: (Risks of general anesthesia discussed including, but not limited to, sore throat, hoarse voice, chipped/damaged teeth, injury to vocal cords, nausea and vomiting, allergic reactions, lung infection,  heart attack, stroke, and death. All questions answered. )         Anesthesia Quick Evaluation

## 2022-11-03 NOTE — Anesthesia Procedure Notes (Signed)
Procedure Name: LMA Insertion Date/Time: 11/03/2022 12:03 PM  Performed by: Francie Massing, CRNAPre-anesthesia Checklist: Patient identified, Emergency Drugs available, Suction available and Patient being monitored Patient Re-evaluated:Patient Re-evaluated prior to induction Oxygen Delivery Method: Circle system utilized Preoxygenation: Pre-oxygenation with 100% oxygen Induction Type: IV induction Ventilation: Mask ventilation without difficulty LMA: LMA inserted LMA Size: 4.0 Number of attempts: 1 Airway Equipment and Method: Bite block Placement Confirmation: positive ETCO2 Tube secured with: Tape Dental Injury: Teeth and Oropharynx as per pre-operative assessment

## 2022-11-03 NOTE — Discharge Instructions (Addendum)
CALL  IF TEMP>100.4, NOTHING PER VAGINA X 1 WK, CALL IF SOAKING A MAXI  PAD EVERY HOUR OR MORE FREQUENTLY   Post Anesthesia Home Care Instructions  Activity: Get plenty of rest for the remainder of the day. A responsible individual must stay with you for 24 hours following the procedure.  For the next 24 hours, DO NOT: -Drive a car -Advertising copywriter -Drink alcoholic beverages -Take any medication unless instructed by your physician -Make any legal decisions or sign important papers.  Meals: Start with liquid foods such as gelatin or soup. Progress to regular foods as tolerated. Avoid greasy, spicy, heavy foods. If nausea and/or vomiting occur, drink only clear liquids until the nausea and/or vomiting subsides. Call your physician if vomiting continues.  Special Instructions/Symptoms: Your throat may feel dry or sore from the anesthesia or the breathing tube placed in your throat during surgery. If this causes discomfort, gargle with warm salt water. The discomfort should disappear within 24 hours.  If you had a scopolamine patch placed behind your ear for the management of post- operative nausea and/or vomiting:  1. The medication in the patch is effective for 72 hours, after which it should be removed.  Wrap patch in a tissue and discard in the trash. Wash hands thoroughly with soap and water. 2. You may remove the patch earlier than 72 hours if you experience unpleasant side effects which may include dry mouth, dizziness or visual disturbances. 3. Avoid touching the patch. Wash your hands with soap and water after contact with the patch.    No ibuprofen, Advil, Aleve, Motrin, ketorolac, meloxicam, naproxen, or other NSAIDS until after 6:30 pm today if needed. No acetaminophen/Tylenol until after 4 pm today if needed.

## 2022-11-03 NOTE — Op Note (Unsigned)
[name], SCHLEIGER MEDICAL RECORD NO: [id number] ACCOUNT NO: [id number] DATE OF BIRTH: [birth year] FACILITY: [facility] LOCATION: [facility] PHYSICIAN: Slayter Moorhouse A. Cherly Hensen, MD  Operative Report   DATE OF PROCEDURE: 11/03/2022  PREOPERATIVE DIAGNOSES: abnormal uterine bleeding, endometrial polyps.  PROCEDURE:  Diagnostic hysteroscopy, hysteroscopic resection of endometrial polyp using MyoSure, dilation and curettage.  POSTOPERATIVE DIAGNOSES:  abnormal uterine bleeding, endometrial polyps.  ANESTHESIA:  General.  SURGEON:  Nalea Salce A. Cherly Hensen, MD  ASSISTANT:  None.  DESCRIPTION OF PROCEDURE:  Under adequate general anesthesia, the patient was placed in the dorsal lithotomy position.  She was sterilely prepped and draped in the usual fashion.  The patient had voided prior to entering the room and therefore she was  not catheterized.  Bivalve speculum was placed in the vagina.  A single-tooth tenaculum was placed on the anterior lip of the cervix.  The cervix easily accepted #19 Pratt dilator.  Diagnostic hysteroscope was introduced into the uterine cavity.  Both  tubal ostia could be seen, however, a large polypoid lesion consistent with a polyp arising from the fundal area was noted and a smaller one was noted lateral to that. Using the Lite resectoscope, the entire endometrium and endometrial polyps were all  resected. The endocervical canal was inspected and no lesions were noted.  At that point, all instruments were then removed.  SPECIMEN:  Labeled endometrial curettings with polyps was sent to pathology.  FLUID DEFICIT:  150 ml.  ESTIMATED BLOOD LOSS:  2 mL  COMPLICATIONS:  None.  DISPOSITION:  The patient tolerated the procedure well and was transferred to recovery room in stable condition.   VAI D: 11/03/2022 12:55:17 pm T: 11/03/2022 7:43:00 pm  JOB: [id number]/ [id number]

## 2022-11-03 NOTE — Anesthesia Postprocedure Evaluation (Signed)
Anesthesia Post Note  Patient: Vanessa [name]  Procedure(s) Performed: DILATATION & CURETTAGE/HYSTEROSCOPY WITH MYOSURE     Patient location during evaluation: PACU Anesthesia Type: General Level of consciousness: awake Pain management: pain level controlled Vital Signs Assessment: post-procedure vital signs reviewed and stable Respiratory status: spontaneous breathing, nonlabored ventilation and respiratory function stable Cardiovascular status: blood pressure returned to baseline and stable Postop Assessment: no apparent nausea or vomiting Anesthetic complications: no   No notable events documented.  Last Vitals:  Vitals:   11/03/22 1315 11/03/22 1413  BP: (!) 116/58 108/68  Pulse: 66 85  Resp: 14 16  Temp:  36.8 C  SpO2: 99% 100%    Last Pain:  Vitals:   11/03/22 1413  TempSrc: Oral  PainSc: 0-No pain                 Linton Rump

## 2022-11-03 NOTE — Progress Notes (Signed)
Alert and oriented x 4, gait very steady

## 2022-11-03 NOTE — H&P (Signed)
Almeter Morine is an 47 y.o. female. G2P2 DF presents for diagnostic hysteroscopy, hysteroscopic resection of endometrial polyps using myosure, dilation and curettage. Pt presented with c/o menometrorrhagia for which sonogram showed endometrial masses c/w endometrial polyp  Pertinent Gynecological History: Menses: flow is excessive with use of 5 pads or tampons on heaviest days Bleeding: menometrorrhagia Contraception: none DES exposure: denies Blood transfusions: none Sexually transmitted diseases: no past history Previous GYN Procedures:  C/S x 2   Last mammogram: normal Date: 2023 Last pap: normal Date: 2024 OB History: G2, P2   Menstrual History: Menarche age: n/a Patient's last menstrual period was 10/12/2022.    Past Medical History:  Diagnosis Date   GERD (gastroesophageal reflux disease)    Postpartum care following cesarean delivery Indication: NRFHR (2/7) 04/26/2016    Past Surgical History:  Procedure Laterality Date   CESAREAN SECTION N/A 04/26/2016   Procedure: CESAREAN SECTION;  Surgeon: Genia Del, MD;  Location: [facility];  Service: Obstetrics;  Laterality: N/A;   CESAREAN SECTION N/A 08/01/2018   Procedure: REPEAT CESAREAN SECTION;  Surgeon: Maxie Better, MD;  Location: [facility];  Service: Obstetrics;  Laterality: N/A;    Family History  Problem Relation Age of Onset   Kidney disease Mother    Diabetes Mother    Cancer Mother    Cancer Maternal Aunt    Diabetes Father     Social History:  reports that she has never smoked. She has never used smokeless tobacco. She reports that she does not drink alcohol and does not use drugs.  Allergies:  Allergies  Allergen Reactions   Fluconazole Nausea And Vomiting and Other (See Comments)    thrush    No medications prior to admission.    Review of Systems  All other systems reviewed and are negative.   Height 5\' 1"  (1.549 m), weight 55.3 kg, last menstrual period 10/12/2022, not  currently breastfeeding. Physical Exam Constitutional:      Appearance: Normal appearance.  HENT:     Head: Atraumatic.     Mouth/Throat:     Mouth: Mucous membranes are moist.  Eyes:     Extraocular Movements: Extraocular movements intact.  Cardiovascular:     Rate and Rhythm: Regular rhythm.     Heart sounds: Normal heart sounds.  Pulmonary:     Breath sounds: Normal breath sounds.  Abdominal:     Palpations: Abdomen is soft.     Comments: Pfannenstiel scar  Genitourinary:    General: Normal vulva.     Comments: Vagina no lesion Cervix nulliparous Uterus AV  Adnexa nl Musculoskeletal:        General: Normal range of motion.     Cervical back: Neck supple.  Skin:    General: Skin is warm and dry.  Neurological:     General: No focal deficit present.     Mental Status: She is alert and oriented to person, place, and time.  Psychiatric:        Mood and Affect: Mood normal.        Behavior: Behavior normal.     No results found for this or any previous visit (from the past 24 hour(s)).  No results found.  Assessment/Plan: Menometrorrhagia Endometrial polyps P) dx hysteroscopy, hysteroscopic resection of endometrial polyp using Myosure, dilation and curettage. Procedure explained. Risk of surgery including infection, bleeding, injury to surrounding organ structures, uterine perforation( 03/998) and its risk, thermal injury, fluid overload and its mgmt. All ? answered  Renton Berkley A Bettylee Feig  11/03/2022, 4:03 AM

## 2022-11-03 NOTE — Transfer of Care (Signed)
Immediate Anesthesia Transfer of Care Note  Patient: Vanessa [name]  Procedure(s) Performed: Procedure(s) (LRB): DILATATION & CURETTAGE/HYSTEROSCOPY WITH MYOSURE (N/A)  Patient Location: PACU  Anesthesia Type: General  Level of Consciousness: awake, oriented, sedated and patient cooperative  Airway & Oxygen Therapy: Patient Spontanous Breathing and Patient connected to face mask oxygen  Post-op Assessment: Report given to PACU RN and Post -op Vital signs reviewed and stable  Post vital signs: Reviewed and stable  Complications: No apparent anesthesia complications Last Vitals:  Vitals Value Taken Time  BP 108/71 11/03/22 1241  Temp    Pulse 74 11/03/22 1245  Resp 11 11/03/22 1245  SpO2 100 % 11/03/22 1245  Vitals shown include unfiled device data.  Last Pain:  Vitals:   11/03/22 0958  TempSrc: Oral  PainSc: 0-No pain      Patients Stated Pain Goal: 5 (11/03/22 0958)  Complications: No notable events documented.

## 2022-11-03 NOTE — Interval H&P Note (Signed)
History and Physical Interval Note:  11/03/2022 11:51 AM  Vanessa [name]  has presented today for surgery, with the diagnosis of Abnormal uterine bleeding, Endometrial poyps.  The various methods of treatment have been discussed with the patient and family. After consideration of risks, benefits and other options for treatment, the patient has consented to  Procedure(s): DILATATION & CURETTAGE/HYSTEROSCOPY WITH MYOSURE (N/A) as a surgical intervention.  The patient's history has been reviewed, patient examined, no change in status, stable for surgery.  I have reviewed the patient's chart and labs.  Questions were answered to the patient's satisfaction.     Montavius Subramaniam A Eirene Rather

## 2022-11-03 NOTE — Brief Op Note (Signed)
11/03/2022  12:56 PM  PATIENT:  Vanessa [name]  47 y.o. female  PRE-OPERATIVE DIAGNOSIS:  Abnormal uterine bleeding, Endometrial poyps  POST-OPERATIVE DIAGNOSIS:  Abnormal uterine bleeding, Endometrial poyps  PROCEDURE:  diagnostic hysteroscopy, hysteroscopic resection of endometrial polyps using myosure, dilation and curettage  SURGEON:  Surgeons and Role:    * Maxie Better, MD - Primary  PHYSICIAN ASSISTANT:   ASSISTANTS: none   ANESTHESIA:   general  EBL:  10 mL   BLOOD ADMINISTERED:none  DRAINS: none   LOCAL MEDICATIONS USED:  NONE  SPECIMEN:  Source of Specimen:  emc with polyp  DISPOSITION OF SPECIMEN:  PATHOLOGY  COUNTS:  YES  TOURNIQUET:  * No tourniquets in log *  DICTATION: .Other Dictation: Dictation Number [id number]  PLAN OF CARE: Discharge to home after PACU  PATIENT DISPOSITION:  PACU - hemodynamically stable.   Delay start of Pharmacological VTE agent (>24hrs) due to surgical blood loss or risk of bleeding: no

## 2022-11-06 ENCOUNTER — Encounter (HOSPITAL_BASED_OUTPATIENT_CLINIC_OR_DEPARTMENT_OTHER): Payer: Self-pay | Admitting: Obstetrics and Gynecology

## 2022-11-06 LAB — SURGICAL PATHOLOGY
